# Patient Record
Sex: Male | Born: 1960 | Hispanic: No | Marital: Single | Smoking: Never smoker
Health system: Southern US, Community
[De-identification: ages and names within clinical notes are randomized; demographics above are authoritative.]

## PROBLEM LIST (undated history)

## (undated) DIAGNOSIS — R7989 Other specified abnormal findings of blood chemistry: Secondary | ICD-10-CM

## (undated) DIAGNOSIS — B009 Herpesviral infection, unspecified: Secondary | ICD-10-CM

## (undated) DIAGNOSIS — K589 Irritable bowel syndrome without diarrhea: Secondary | ICD-10-CM

## (undated) DIAGNOSIS — A6000 Herpesviral infection of urogenital system, unspecified: Secondary | ICD-10-CM

## (undated) DIAGNOSIS — N400 Enlarged prostate without lower urinary tract symptoms: Secondary | ICD-10-CM

## (undated) DIAGNOSIS — T7840XA Allergy, unspecified, initial encounter: Secondary | ICD-10-CM

## (undated) DIAGNOSIS — E785 Hyperlipidemia, unspecified: Secondary | ICD-10-CM

## (undated) DIAGNOSIS — I10 Essential (primary) hypertension: Secondary | ICD-10-CM

## (undated) DIAGNOSIS — K644 Residual hemorrhoidal skin tags: Secondary | ICD-10-CM

## (undated) DIAGNOSIS — F419 Anxiety disorder, unspecified: Secondary | ICD-10-CM

## (undated) DIAGNOSIS — K279 Peptic ulcer, site unspecified, unspecified as acute or chronic, without hemorrhage or perforation: Secondary | ICD-10-CM

## (undated) HISTORY — DX: Hyperlipidemia, unspecified: E78.5

## (undated) HISTORY — DX: Residual hemorrhoidal skin tags: K64.4

## (undated) HISTORY — DX: Anxiety disorder, unspecified: F41.9

## (undated) HISTORY — DX: Allergy, unspecified, initial encounter: T78.40XA

## (undated) HISTORY — DX: Herpesviral infection of urogenital system, unspecified: A60.00

## (undated) HISTORY — DX: Benign prostatic hyperplasia without lower urinary tract symptoms: N40.0

## (undated) HISTORY — DX: Peptic ulcer, site unspecified, unspecified as acute or chronic, without hemorrhage or perforation: K27.9

## (undated) HISTORY — DX: Irritable bowel syndrome, unspecified: K58.9

---

## 2005-10-19 ENCOUNTER — Emergency Department (HOSPITAL_COMMUNITY): Admission: EM | Admit: 2005-10-19 | Discharge: 2005-10-20 | Payer: Self-pay | Admitting: *Deleted

## 2005-10-24 ENCOUNTER — Emergency Department (HOSPITAL_COMMUNITY): Admission: EM | Admit: 2005-10-24 | Discharge: 2005-10-25 | Payer: Self-pay | Admitting: Emergency Medicine

## 2009-09-11 ENCOUNTER — Emergency Department (HOSPITAL_COMMUNITY): Admission: EM | Admit: 2009-09-11 | Discharge: 2009-09-12 | Payer: Self-pay | Admitting: Emergency Medicine

## 2009-10-29 ENCOUNTER — Emergency Department (HOSPITAL_COMMUNITY): Admission: EM | Admit: 2009-10-29 | Discharge: 2009-10-30 | Payer: Self-pay | Admitting: Emergency Medicine

## 2010-01-24 ENCOUNTER — Emergency Department (HOSPITAL_COMMUNITY): Admission: EM | Admit: 2010-01-24 | Discharge: 2010-01-24 | Payer: Self-pay | Admitting: Emergency Medicine

## 2010-08-18 ENCOUNTER — Emergency Department (HOSPITAL_COMMUNITY): Admission: EM | Admit: 2010-08-18 | Discharge: 2009-09-23 | Payer: Self-pay | Admitting: Emergency Medicine

## 2010-08-25 ENCOUNTER — Emergency Department (HOSPITAL_COMMUNITY)
Admission: EM | Admit: 2010-08-25 | Discharge: 2010-08-25 | Payer: Self-pay | Source: Home / Self Care | Admitting: Emergency Medicine

## 2010-09-22 ENCOUNTER — Emergency Department (HOSPITAL_COMMUNITY)
Admission: EM | Admit: 2010-09-22 | Discharge: 2010-09-22 | Payer: Self-pay | Source: Home / Self Care | Admitting: Emergency Medicine

## 2010-09-26 LAB — URINALYSIS, ROUTINE W REFLEX MICROSCOPIC
Bilirubin Urine: NEGATIVE
Hgb urine dipstick: NEGATIVE
Ketones, ur: NEGATIVE mg/dL
Nitrite: NEGATIVE
Protein, ur: NEGATIVE mg/dL
Specific Gravity, Urine: 1.007 (ref 1.005–1.030)
Urine Glucose, Fasting: NEGATIVE mg/dL
Urobilinogen, UA: 0.2 mg/dL (ref 0.0–1.0)
pH: 5.5 (ref 5.0–8.0)

## 2010-09-26 LAB — CBC
HCT: 45.3 % (ref 39.0–52.0)
Hemoglobin: 16.3 g/dL (ref 13.0–17.0)
MCH: 31.2 pg (ref 26.0–34.0)
MCHC: 36 g/dL (ref 30.0–36.0)
MCV: 86.6 fL (ref 78.0–100.0)
Platelets: 297 10*3/uL (ref 150–400)
RBC: 5.23 MIL/uL (ref 4.22–5.81)
RDW: 12.8 % (ref 11.5–15.5)
WBC: 8.6 10*3/uL (ref 4.0–10.5)

## 2010-09-26 LAB — DIFFERENTIAL
Basophils Absolute: 0 10*3/uL (ref 0.0–0.1)
Basophils Relative: 1 % (ref 0–1)
Eosinophils Absolute: 0.1 10*3/uL (ref 0.0–0.7)
Eosinophils Relative: 1 % (ref 0–5)
Lymphocytes Relative: 22 % (ref 12–46)
Lymphs Abs: 1.9 10*3/uL (ref 0.7–4.0)
Monocytes Absolute: 0.7 10*3/uL (ref 0.1–1.0)
Monocytes Relative: 9 % (ref 3–12)
Neutro Abs: 5.9 10*3/uL (ref 1.7–7.7)
Neutrophils Relative %: 68 % (ref 43–77)

## 2010-09-26 LAB — RAPID URINE DRUG SCREEN, HOSP PERFORMED
Amphetamines: NOT DETECTED
Barbiturates: NOT DETECTED
Benzodiazepines: NOT DETECTED
Cocaine: NOT DETECTED
Opiates: NOT DETECTED
Tetrahydrocannabinol: NOT DETECTED

## 2010-09-26 LAB — BASIC METABOLIC PANEL
BUN: 10 mg/dL (ref 6–23)
CO2: 26 mEq/L (ref 19–32)
Calcium: 9.5 mg/dL (ref 8.4–10.5)
Chloride: 107 mEq/L (ref 96–112)
Creatinine, Ser: 0.95 mg/dL (ref 0.4–1.5)
GFR calc Af Amer: >60 mL/min (ref >60)
GFR calc non Af Amer: >60 mL/min (ref >60)
Glucose, Bld: 103 mg/dL — ABNORMAL HIGH (ref 70–99)
Potassium: 4.2 mEq/L (ref 3.5–5.1)
Sodium: 142 mEq/L (ref 135–145)

## 2010-09-26 LAB — CK: Total CK: 1035 U/L — ABNORMAL HIGH (ref 7–232)

## 2010-09-27 ENCOUNTER — Emergency Department (HOSPITAL_COMMUNITY)
Admission: EM | Admit: 2010-09-27 | Discharge: 2010-09-27 | Payer: Self-pay | Source: Home / Self Care | Admitting: Emergency Medicine

## 2010-09-28 LAB — POCT I-STAT, CHEM 8
BUN: 11 mg/dL (ref 6–23)
Calcium, Ion: 1.16 mmol/L (ref 1.12–1.32)
Chloride: 100 mEq/L (ref 96–112)
Creatinine, Ser: 0.9 mg/dL (ref 0.4–1.5)
Glucose, Bld: 90 mg/dL (ref 70–99)
HCT: 46 % (ref 39.0–52.0)
Hemoglobin: 15.6 g/dL (ref 13.0–17.0)
Potassium: 3.7 mEq/L (ref 3.5–5.1)
Sodium: 140 mEq/L (ref 135–145)
TCO2: 27 mmol/L (ref 0–100)

## 2010-11-21 LAB — RAPID STREP SCREEN (MED CTR MEBANE ONLY): Streptococcus, Group A Screen (Direct): NEGATIVE

## 2010-11-27 LAB — COMPREHENSIVE METABOLIC PANEL
ALT: 25 U/L (ref 0–53)
AST: 23 U/L (ref 0–37)
Albumin: 3.9 g/dL (ref 3.5–5.2)
Alkaline Phosphatase: 59 U/L (ref 39–117)
BUN: 11 mg/dL (ref 6–23)
CO2: 26 mEq/L (ref 19–32)
Calcium: 8.8 mg/dL (ref 8.4–10.5)
Chloride: 106 mEq/L (ref 96–112)
Creatinine, Ser: 0.74 mg/dL (ref 0.4–1.5)
GFR calc Af Amer: >60 mL/min (ref >60)
GFR calc non Af Amer: >60 mL/min (ref >60)
Glucose, Bld: 112 mg/dL — ABNORMAL HIGH (ref 70–99)
Potassium: 3.4 mEq/L — ABNORMAL LOW (ref 3.5–5.1)
Sodium: 139 mEq/L (ref 135–145)
Total Bilirubin: 0.5 mg/dL (ref 0.3–1.2)
Total Protein: 6.5 g/dL (ref 6.0–8.3)

## 2010-11-27 LAB — POCT CARDIAC MARKERS
CKMB, poc: 1.7 ng/mL (ref 1.0–8.0)
CKMB, poc: 1.9 ng/mL (ref 1.0–8.0)
CKMB, poc: 2.4 ng/mL (ref 1.0–8.0)
Myoglobin, poc: 56.5 ng/mL (ref 12–200)
Myoglobin, poc: 56.9 ng/mL (ref 12–200)
Myoglobin, poc: 62.6 ng/mL (ref 12–200)
Troponin i, poc: <0.05 ng/mL (ref 0.00–0.09)
Troponin i, poc: <0.05 ng/mL (ref 0.00–0.09)
Troponin i, poc: <0.05 ng/mL (ref 0.00–0.09)

## 2010-11-27 LAB — DIFFERENTIAL
Basophils Absolute: 0 10*3/uL (ref 0.0–0.1)
Basophils Relative: 1 % (ref 0–1)
Eosinophils Absolute: 0.3 10*3/uL (ref 0.0–0.7)
Eosinophils Relative: 5 % (ref 0–5)
Lymphocytes Relative: 23 % (ref 12–46)
Lymphs Abs: 1.5 10*3/uL (ref 0.7–4.0)
Monocytes Absolute: 0.7 10*3/uL (ref 0.1–1.0)
Monocytes Relative: 10 % (ref 3–12)
Neutro Abs: 4.1 10*3/uL (ref 1.7–7.7)
Neutrophils Relative %: 61 % (ref 43–77)

## 2010-11-27 LAB — CBC
HCT: 43.7 % (ref 39.0–52.0)
Hemoglobin: 15.4 g/dL (ref 13.0–17.0)
MCHC: 35.3 g/dL (ref 30.0–36.0)
MCV: 89.6 fL (ref 78.0–100.0)
Platelets: 274 10*3/uL (ref 150–400)
RBC: 4.88 MIL/uL (ref 4.22–5.81)
RDW: 12.7 % (ref 11.5–15.5)
WBC: 6.6 10*3/uL (ref 4.0–10.5)

## 2010-11-27 LAB — POCT I-STAT, CHEM 8
BUN: 10 mg/dL (ref 6–23)
Calcium, Ion: 1.15 mmol/L (ref 1.12–1.32)
Chloride: 103 mEq/L (ref 96–112)
Creatinine, Ser: 0.7 mg/dL (ref 0.4–1.5)
Glucose, Bld: 88 mg/dL (ref 70–99)
HCT: 48 % (ref 39.0–52.0)
Hemoglobin: 16.3 g/dL (ref 13.0–17.0)
Potassium: 3.4 mEq/L — ABNORMAL LOW (ref 3.5–5.1)
Sodium: 140 mEq/L (ref 135–145)
TCO2: 28 mmol/L (ref 0–100)

## 2010-11-27 LAB — D-DIMER, QUANTITATIVE: D-Dimer, Quant: <0.22 ug/mL-FEU (ref 0.00–0.48)

## 2010-11-28 LAB — DIFFERENTIAL
Basophils Absolute: 0 10*3/uL (ref 0.0–0.1)
Basophils Relative: 0 % (ref 0–1)
Eosinophils Absolute: 0.1 10*3/uL (ref 0.0–0.7)
Eosinophils Relative: 1 % (ref 0–5)
Lymphocytes Relative: 21 % (ref 12–46)
Lymphs Abs: 1.8 10*3/uL (ref 0.7–4.0)
Monocytes Absolute: 0.7 10*3/uL (ref 0.1–1.0)
Monocytes Relative: 7 % (ref 3–12)
Neutro Abs: 6.4 10*3/uL (ref 1.7–7.7)
Neutrophils Relative %: 71 % (ref 43–77)

## 2010-11-28 LAB — COMPREHENSIVE METABOLIC PANEL
ALT: 28 U/L (ref 0–53)
AST: 39 U/L — ABNORMAL HIGH (ref 0–37)
Albumin: 4 g/dL (ref 3.5–5.2)
Alkaline Phosphatase: 50 U/L (ref 39–117)
BUN: 8 mg/dL (ref 6–23)
CO2: 23 mEq/L (ref 19–32)
Calcium: 9 mg/dL (ref 8.4–10.5)
Chloride: 103 mEq/L (ref 96–112)
Creatinine, Ser: 0.75 mg/dL (ref 0.4–1.5)
GFR calc Af Amer: >60 mL/min (ref >60)
GFR calc non Af Amer: >60 mL/min (ref >60)
Glucose, Bld: 110 mg/dL — ABNORMAL HIGH (ref 70–99)
Potassium: 4.2 mEq/L (ref 3.5–5.1)
Sodium: 134 mEq/L — ABNORMAL LOW (ref 135–145)
Total Bilirubin: 1.3 mg/dL — ABNORMAL HIGH (ref 0.3–1.2)
Total Protein: 6.7 g/dL (ref 6.0–8.3)

## 2010-11-28 LAB — POCT CARDIAC MARKERS
Myoglobin, poc: 94.6 ng/mL (ref 12–200)
Troponin i, poc: <0.05 ng/mL (ref 0.00–0.09)

## 2010-11-28 LAB — URINALYSIS, ROUTINE W REFLEX MICROSCOPIC
Bilirubin Urine: NEGATIVE
Glucose, UA: NEGATIVE mg/dL
Hgb urine dipstick: NEGATIVE
Ketones, ur: NEGATIVE mg/dL
Nitrite: NEGATIVE
Protein, ur: NEGATIVE mg/dL
Specific Gravity, Urine: 1.003 — ABNORMAL LOW (ref 1.005–1.030)
Urobilinogen, UA: 0.2 mg/dL (ref 0.0–1.0)
pH: 7 (ref 5.0–8.0)

## 2010-11-28 LAB — URINE CULTURE
Colony Count: NO GROWTH
Culture: NO GROWTH

## 2010-11-28 LAB — CBC
HCT: 44.4 % (ref 39.0–52.0)
Hemoglobin: 15.5 g/dL (ref 13.0–17.0)
MCHC: 34.8 g/dL (ref 30.0–36.0)
MCV: 90.3 fL (ref 78.0–100.0)
Platelets: 298 10*3/uL (ref 150–400)
RBC: 4.92 MIL/uL (ref 4.22–5.81)
RDW: 12.5 % (ref 11.5–15.5)
WBC: 9 10*3/uL (ref 4.0–10.5)

## 2010-11-30 LAB — POCT I-STAT, CHEM 8
BUN: 11 mg/dL (ref 6–23)
Calcium, Ion: 1 mmol/L — ABNORMAL LOW (ref 1.12–1.32)
HCT: 45 % (ref 39.0–52.0)
Hemoglobin: 15.3 g/dL (ref 13.0–17.0)
Sodium: 139 mEq/L (ref 135–145)
TCO2: 29 mmol/L (ref 0–100)

## 2010-11-30 LAB — POCT CARDIAC MARKERS
Myoglobin, poc: 47.5 ng/mL (ref 12–200)
Troponin i, poc: <0.05 ng/mL (ref 0.00–0.09)

## 2010-11-30 LAB — URINALYSIS, ROUTINE W REFLEX MICROSCOPIC
Bilirubin Urine: NEGATIVE
Glucose, UA: NEGATIVE mg/dL
Ketones, ur: NEGATIVE mg/dL
Nitrite: NEGATIVE
Protein, ur: NEGATIVE mg/dL
pH: 8 (ref 5.0–8.0)

## 2010-11-30 LAB — DIFFERENTIAL
Basophils Absolute: 0 10*3/uL (ref 0.0–0.1)
Basophils Relative: 1 % (ref 0–1)
Eosinophils Relative: 2 % (ref 0–5)
Lymphocytes Relative: 25 % (ref 12–46)
Monocytes Absolute: 0.8 10*3/uL (ref 0.1–1.0)

## 2010-11-30 LAB — CBC
HCT: 44.5 % (ref 39.0–52.0)
Hemoglobin: 15.6 g/dL (ref 13.0–17.0)
MCHC: 35.1 g/dL (ref 30.0–36.0)
Platelets: 314 10*3/uL (ref 150–400)
RDW: 12.6 % (ref 11.5–15.5)

## 2012-09-11 DIAGNOSIS — K279 Peptic ulcer, site unspecified, unspecified as acute or chronic, without hemorrhage or perforation: Secondary | ICD-10-CM

## 2012-09-11 HISTORY — DX: Peptic ulcer, site unspecified, unspecified as acute or chronic, without hemorrhage or perforation: K27.9

## 2012-10-01 ENCOUNTER — Emergency Department (HOSPITAL_COMMUNITY)
Admission: EM | Admit: 2012-10-01 | Discharge: 2012-10-01 | Disposition: A | Discharge status: Home / Self Care | Payer: Self-pay | Attending: Emergency Medicine | Admitting: Emergency Medicine

## 2012-10-01 ENCOUNTER — Emergency Department (HOSPITAL_COMMUNITY): Payer: Self-pay

## 2012-10-01 ENCOUNTER — Encounter (HOSPITAL_COMMUNITY): Payer: Self-pay | Admitting: Family Medicine

## 2012-10-01 DIAGNOSIS — I10 Essential (primary) hypertension: Secondary | ICD-10-CM | POA: Insufficient documentation

## 2012-10-01 DIAGNOSIS — Z79899 Other long term (current) drug therapy: Secondary | ICD-10-CM | POA: Insufficient documentation

## 2012-10-01 DIAGNOSIS — R531 Weakness: Secondary | ICD-10-CM

## 2012-10-01 DIAGNOSIS — Z8619 Personal history of other infectious and parasitic diseases: Secondary | ICD-10-CM | POA: Insufficient documentation

## 2012-10-01 DIAGNOSIS — R5383 Other fatigue: Secondary | ICD-10-CM | POA: Insufficient documentation

## 2012-10-01 DIAGNOSIS — R079 Chest pain, unspecified: Secondary | ICD-10-CM | POA: Insufficient documentation

## 2012-10-01 DIAGNOSIS — R5381 Other malaise: Secondary | ICD-10-CM | POA: Insufficient documentation

## 2012-10-01 HISTORY — DX: Herpesviral infection, unspecified: B00.9

## 2012-10-01 HISTORY — DX: Essential (primary) hypertension: I10

## 2012-10-01 HISTORY — DX: Other specified abnormal findings of blood chemistry: R79.89

## 2012-10-01 LAB — CBC
HCT: 46.3 % (ref 39.0–52.0)
Hemoglobin: 16.5 g/dL (ref 13.0–17.0)
MCV: 87.5 fL (ref 78.0–100.0)
RBC: 5.29 MIL/uL (ref 4.22–5.81)
RDW: 12.2 % (ref 11.5–15.5)
WBC: 6.6 10*3/uL (ref 4.0–10.5)

## 2012-10-01 LAB — POCT I-STAT, CHEM 8
BUN: 10 mg/dL (ref 6–23)
Calcium, Ion: 1.17 mmol/L (ref 1.12–1.23)
Chloride: 104 mEq/L (ref 96–112)
Glucose, Bld: 96 mg/dL (ref 70–99)
HCT: 47 % (ref 39.0–52.0)
Potassium: 4.2 mEq/L (ref 3.5–5.1)

## 2012-10-01 NOTE — ED Provider Notes (Signed)
History     CSN: [id number]  Arrival date & time 10/01/12  4098   First MD Initiated Contact with Patient 10/01/12 2019      Chief Complaint  Patient presents with  . Fatigue  . Chest Pain    (Consider location/radiation/quality/duration/timing/severity/associated sxs/prior treatment) Patient is a 52 y.o. male presenting with chest pain. The history is provided by the patient.  Chest Pain    patient here complaining of chronic fatigue and chest weakness. Symptoms have been persistent. He denies any anginal type chest pain. No fever or cough. Symptoms are nonexertional. No associated leg pain or swelling. No treatment done for this prior to arrival. Some shortness of breath that is nonexertional as well 2. No prior history of coronary disease.  Past Medical History  Diagnosis Date  . Low testosterone   . Hypertension   . Herpes     History reviewed. No pertinent past surgical history.  No family history on file.  History  Substance Use Topics  . Smoking status: Never Smoker   . Smokeless tobacco: Not on file  . Alcohol Use: No      Review of Systems  Cardiovascular: Positive for chest pain.  All other systems reviewed and are negative.    Allergies  Review of patient's allergies indicates no known allergies.  Home Medications   Current Outpatient Rx  Name  Route  Sig  Dispense  Refill  . ALPRAZOLAM 1 MG PO TABS   Oral   Take 1 mg by mouth 3 (three) times daily as needed. Anxiety         . IBUPROFEN 200 MG PO TABS   Oral   Take 200 mg by mouth every 6 (six) hours as needed. Pain         . NEBIVOLOL HCL 10 MG PO TABS   Oral   Take 10 mg by mouth daily.         Marland Kitchen OVER THE COUNTER MEDICATION   Oral   Take 1 tablet by mouth daily. 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 VALACYCLOVIR HCL 500 MG PO TABS   Oral   Take 500 mg by mouth 2 (two) times daily.           BP 135/70  Pulse 62  Temp 98.9 F (37.2 C) (Oral)  Resp 31  SpO2 100%  Physical Exam    Nursing note and vitals reviewed. Constitutional: He is oriented to person, place, and time. He appears well-developed and well-nourished.  Non-toxic appearance. No distress.  HENT:  Head: Normocephalic and atraumatic.  Eyes: Conjunctivae normal, EOM and lids are normal. Pupils are equal, round, and reactive to light.  Neck: Normal range of motion. Neck supple. No tracheal deviation present. No mass present.  Cardiovascular: Normal rate, regular rhythm and normal heart sounds.  Exam reveals no gallop.   No murmur heard. Pulmonary/Chest: Effort normal and breath sounds normal. No stridor. No respiratory distress. He has no decreased breath sounds. He has no wheezes. He has no rhonchi. He has no rales.  Abdominal: Soft. Normal appearance and bowel sounds are normal. He exhibits no distension. There is no tenderness. There is no rebound and no CVA tenderness.  Musculoskeletal: Normal range of motion. He exhibits no edema and no tenderness.  Neurological: He is alert and oriented to person, place, and time. He has normal strength. No cranial nerve deficit or sensory deficit. GCS eye subscore is 4. GCS verbal subscore is 5. GCS  motor subscore is 6.  Skin: Skin is warm and dry. No abrasion and no rash noted.  Psychiatric: He has a normal mood and affect. His speech is normal and behavior is normal.    ED Course  Procedures (including critical care time)   Labs Reviewed  POCT I-STAT, CHEM 8  POCT I-STAT TROPONIN I  CBC   No results found.   No diagnosis found.    MDM   Date: 10/01/2012  Rate: 65  Rhythm: normal sinus rhythm  QRS Axis: normal  Intervals: normal  ST/T Wave abnormalities: normal  Conduction Disutrbances:none  Narrative Interpretation:   Old EKG Reviewed: none available  9:19 PM Patient with negative workup here. No concern for ACS or pulmonary embolism. No signs of CVA. Patient followup with his medical doctor.        Toy Baker, MD 10/01/12 2120

## 2012-10-01 NOTE — ED Notes (Signed)
Also complaints of headache and nasal discharge

## 2012-10-01 NOTE — ED Notes (Signed)
Pt also states he has fevers inside body

## 2012-10-01 NOTE — ED Notes (Signed)
Patient states "I feel like I lose power." States that he has felt weak for the past 3 days. States that although he has been eating he feels "empty." States that he started having chest pain 4 days ago. Chest pain is intermittent with shortness of breath. Reports that he has a history of low testosterone but that he only takes half of his prescribed medication.

## 2013-03-17 ENCOUNTER — Encounter (HOSPITAL_COMMUNITY): Payer: Self-pay | Admitting: *Deleted

## 2013-03-17 ENCOUNTER — Emergency Department (HOSPITAL_COMMUNITY): Payer: Self-pay

## 2013-03-17 ENCOUNTER — Emergency Department (HOSPITAL_COMMUNITY)
Admission: EM | Admit: 2013-03-17 | Discharge: 2013-03-18 | Disposition: A | Discharge status: Home / Self Care | Payer: Self-pay | Attending: Emergency Medicine | Admitting: Emergency Medicine

## 2013-03-17 DIAGNOSIS — Z79899 Other long term (current) drug therapy: Secondary | ICD-10-CM | POA: Insufficient documentation

## 2013-03-17 DIAGNOSIS — D72829 Elevated white blood cell count, unspecified: Secondary | ICD-10-CM | POA: Insufficient documentation

## 2013-03-17 DIAGNOSIS — R509 Fever, unspecified: Secondary | ICD-10-CM | POA: Insufficient documentation

## 2013-03-17 DIAGNOSIS — I10 Essential (primary) hypertension: Secondary | ICD-10-CM | POA: Insufficient documentation

## 2013-03-17 DIAGNOSIS — Z8619 Personal history of other infectious and parasitic diseases: Secondary | ICD-10-CM | POA: Insufficient documentation

## 2013-03-17 DIAGNOSIS — R109 Unspecified abdominal pain: Secondary | ICD-10-CM | POA: Insufficient documentation

## 2013-03-17 LAB — URINALYSIS, ROUTINE W REFLEX MICROSCOPIC
Bilirubin Urine: NEGATIVE
Hgb urine dipstick: NEGATIVE
Ketones, ur: NEGATIVE mg/dL
Protein, ur: NEGATIVE mg/dL
Urobilinogen, UA: 0.2 mg/dL (ref 0.0–1.0)

## 2013-03-17 LAB — CBC WITH DIFFERENTIAL/PLATELET
Eosinophils Relative: 1 % (ref 0–5)
Hemoglobin: 16.6 g/dL (ref 13.0–17.0)
Lymphocytes Relative: 5 % — ABNORMAL LOW (ref 12–46)
Lymphs Abs: 0.7 10*3/uL (ref 0.7–4.0)
MCV: 82 fL (ref 78.0–100.0)
Monocytes Relative: 6 % (ref 3–12)
Neutrophils Relative %: 88 % — ABNORMAL HIGH (ref 43–77)
Platelets: 321 10*3/uL (ref 150–400)
RBC: 5.56 MIL/uL (ref 4.22–5.81)
WBC: 13.6 10*3/uL — ABNORMAL HIGH (ref 4.0–10.5)

## 2013-03-17 LAB — COMPREHENSIVE METABOLIC PANEL
ALT: 24 U/L (ref 0–53)
Alkaline Phosphatase: 51 U/L (ref 39–117)
BUN: 13 mg/dL (ref 6–23)
CO2: 24 mEq/L (ref 19–32)
GFR calc Af Amer: >90 mL/min (ref >90)
GFR calc non Af Amer: 81 mL/min — ABNORMAL LOW (ref >90)
Glucose, Bld: 113 mg/dL — ABNORMAL HIGH (ref 70–99)
Potassium: 4.1 mEq/L (ref 3.5–5.1)
Sodium: 130 mEq/L — ABNORMAL LOW (ref 135–145)

## 2013-03-17 MED ORDER — IOHEXOL 300 MG/ML  SOLN
50.0000 mL | Freq: Once | INTRAMUSCULAR | Status: AC | PRN
  Administered 2013-03-17: 50 mL via ORAL

## 2013-03-17 MED ORDER — ACETAMINOPHEN 325 MG PO TABS
650.0000 mg | ORAL_TABLET | Freq: Once | ORAL | Status: AC
Start: 2013-03-17 — End: 2013-03-17
  Administered 2013-03-17: 650 mg via ORAL
  Filled 2013-03-17: qty 2

## 2013-03-17 MED ORDER — IOHEXOL 300 MG/ML  SOLN
100.0000 mL | Freq: Once | INTRAMUSCULAR | Status: AC | PRN
  Administered 2013-03-17: 100 mL via INTRAVENOUS

## 2013-03-17 NOTE — ED Provider Notes (Signed)
History    CSN: [id number] Arrival date & time 03/17/13  2042  First MD Initiated Contact with Patient 03/17/13 2119     Chief Complaint  Patient presents with  . Abdominal Pain   (Consider location/radiation/quality/duration/timing/severity/associated sxs/prior Treatment) HPI Comments: This is a 52 year old gentleman, who, reports, that he's had 4 days of diffuse abdominal pain, worse when he eats he, feels bloated.  Denies nausea, vomiting, diarrhea, states he took a laxative thinking.  He was, constipated, he's had 2 normal bowel movements one last night, and one this morning, but is still feeling vaguely uncomfortable.  Reports intermittent episodes of chills for the past 24 hours.  He has not taken his temperature were taken any medication for comfort or fever.  Denies any urinary tract symptoms, or penile discharge.  He takes Valtrex for genital herpes.  He is not currently having an outbreak  Patient is a 52 y.o. male presenting with abdominal pain. The history is provided by the patient.  Abdominal Pain This is a new problem. The current episode started in the past 7 days. The problem occurs constantly. The problem has been gradually worsening. Associated symptoms include abdominal pain, anorexia, chills and a fever. Pertinent negatives include no coughing, nausea or rash.   Past Medical History  Diagnosis Date  . Low testosterone   . Hypertension   . Herpes    History reviewed. No pertinent past surgical history. No family history on file. History  Substance Use Topics  . Smoking status: Never Smoker   . Smokeless tobacco: Not on file  . Alcohol Use: No    Review of Systems  Constitutional: Positive for fever and chills.  Respiratory: Negative for cough and shortness of breath.   Gastrointestinal: Positive for abdominal pain, constipation, abdominal distention and anorexia. Negative for nausea, diarrhea and blood in stool.  Genitourinary: Negative for dysuria.  Skin:  Negative for rash and wound.  Neurological: Negative for dizziness.  All other systems reviewed and are negative.    Allergies  Doxycycline  Home Medications   Current Outpatient Rx  Name  Route  Sig  Dispense  Refill  . ALPRAZolam (XANAX) 1 MG tablet   Oral   Take 0.5 mg by mouth 3 (three) times daily as needed (Takes 1/2   tablet). Anxiety         . lisinopril-hydrochlorothiazide (PRINZIDE,ZESTORETIC) 20-12.5 MG per tablet   Oral   Take 1 tablet by mouth daily.         . valACYclovir (VALTREX) 500 MG tablet   Oral   Take 500 mg by mouth 2 (two) times daily.         Marland Kitchen HYDROcodone-acetaminophen (NORCO/VICODIN) 5-325 MG per tablet      Take 1-2 tablets by mouth every 6 hours as needed for pain.   15 tablet   0   . ibuprofen (ADVIL,MOTRIN) 200 MG tablet   Oral   Take 200 mg by mouth every 6 (six) hours as needed. Pain          BP 116/66  Pulse 96  Temp(Src) 97.6 F (36.4 C) (Oral)  Resp 18  Ht 5\' 6"  (1.676 m)  Wt 203 lb 2 oz (92.137 kg)  BMI 32.8 kg/m2  SpO2 97% Physical Exam  Nursing note and vitals reviewed. Constitutional: He appears well-developed and well-nourished.  HENT:  Head: Normocephalic.  Eyes: Pupils are equal, round, and reactive to light.  Neck: Normal range of motion.  Cardiovascular: Regular rhythm.  Tachycardia present.  Pulmonary/Chest: Effort normal and breath sounds normal. He exhibits no tenderness.  Abdominal: Soft. He exhibits distension. Bowel sounds are increased. There is no tenderness. There is no rebound and no guarding.  Genitourinary: Penis normal.  Neurological: He is alert.  Skin: Skin is warm and dry.    ED Course  Procedures (including critical care time) Labs Reviewed  CBC WITH DIFFERENTIAL - Abnormal; Notable for the following:    WBC 13.6 (*)    MCHC 36.4 (*)    Neutrophils Relative % 88 (*)    Neutro Abs 11.9 (*)    Lymphocytes Relative 5 (*)    All other components within normal limits  COMPREHENSIVE  METABOLIC PANEL - Abnormal; Notable for the following:    Sodium 130 (*)    Chloride 94 (*)    Glucose, Bld 113 (*)    GFR calc non Af Amer 81 (*)    All other components within normal limits  LIPASE, BLOOD  URINALYSIS, ROUTINE W REFLEX MICROSCOPIC   No results found. 1. Abdominal pain   2. Fever   3. Leukocytosis     MDM   Patient states he is feeling, better.  He still slightly, tachycardic.  We'll give additional liter of saline.  His temperature has normalized without a source.  Chest x-ray, CT scan of the abdomen, pelvis, or noncontributory.  For fever.  He does not have a urinary tract infection.  She was questioned more extensively about possible tick exposure.  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

## 2013-03-17 NOTE — ED Notes (Signed)
Pt c/o abd pain since Friday; no difficulty with urination; denies vomiting; nausea

## 2013-03-18 ENCOUNTER — Emergency Department (HOSPITAL_COMMUNITY): Payer: Self-pay

## 2013-03-18 MED ORDER — HYDROCODONE-ACETAMINOPHEN 5-325 MG PO TABS
1.0000 | ORAL_TABLET | Freq: Once | ORAL | Status: AC
  Administered 2013-03-18: 1 via ORAL
  Filled 2013-03-18: qty 1

## 2013-03-18 MED ORDER — SODIUM CHLORIDE 0.9 % IV BOLUS (SEPSIS)
1000.0000 mL | Freq: Once | INTRAVENOUS | Status: AC
  Administered 2013-03-18: 1000 mL via INTRAVENOUS

## 2013-03-18 MED ORDER — HYDROCODONE-ACETAMINOPHEN 5-325 MG PO TABS: ORAL_TABLET | ORAL | Status: DC

## 2013-03-18 NOTE — ED Provider Notes (Signed)
This is a sign out from NP Tomasa Blase at shift change: Nathan Cherry is a 52 y.o. male complaining of abdominal distention Fever and white count, no RF for RMSF/ricketsial disease. CT shows no abnormalities. Plan is to further hydrate the patient before discharge.  6:53 AM seen and reexamined at the bedside, abdominal exam is benign with very minimal diffuse tenderness to palpation with no guarding or rebound. Patient states that his pain is much improved after receiving fluids and by mouth Vicodin.  Pt is hemodynamically stable, appropriate for, and amenable to discharge at this time. Pt verbalized understanding and agrees with care plan. Outpatient follow-up and specific return precautions discussed.    Filed Vitals:   03/17/13 2046 03/18/13 0116 03/18/13 0519 03/18/13 0753  BP: 115/66 122/87 117/74 116/66  Pulse: 136 124 101 96  Temp: 101.9 F (38.8 C)   97.6 F (36.4 C)  TempSrc: Oral   Oral  Resp: 26 18 18 18   Height: 5\' 6"  (1.676 m)     Weight: 203 lb 2 oz (92.137 kg)     SpO2: 94% 100% 98% 97%     Wynetta Emery, PA-C 03/18/13 1035

## 2013-03-19 NOTE — ED Provider Notes (Signed)
Medical screening examination/treatment/procedure(s) were performed by non-physician practitioner and as supervising physician I was immediately available for consultation/collaboration.  John-Adam Daltyn Degroat, M.D.     John-Adam Ennis Heavner, MD 03/19/13 0404 

## 2013-03-23 NOTE — ED Provider Notes (Signed)
Medical screening examination/treatment/procedure(s) were performed by non-physician practitioner and as supervising physician I was immediately available for consultation/collaboration.  Jones Skene, M.D.     Jones Skene, MD 03/23/13 [phone]

## 2013-06-16 ENCOUNTER — Ambulatory Visit: Payer: No Typology Code available for payment source | Attending: Internal Medicine | Admitting: Internal Medicine

## 2013-06-16 ENCOUNTER — Encounter: Payer: Self-pay | Admitting: Internal Medicine

## 2013-06-16 VITALS — BP 122/67 | HR 74 | Temp 99.2°F | Resp 18 | Wt 208.0 lb

## 2013-06-16 DIAGNOSIS — H109 Unspecified conjunctivitis: Secondary | ICD-10-CM

## 2013-06-16 DIAGNOSIS — Z23 Encounter for immunization: Secondary | ICD-10-CM

## 2013-06-16 DIAGNOSIS — I10 Essential (primary) hypertension: Secondary | ICD-10-CM | POA: Insufficient documentation

## 2013-06-16 DIAGNOSIS — J309 Allergic rhinitis, unspecified: Secondary | ICD-10-CM | POA: Insufficient documentation

## 2013-06-16 LAB — CBC
MCH: 31.3 pg (ref 26.0–34.0)
MCV: 86.6 fL (ref 78.0–100.0)
Platelets: 362 10*3/uL (ref 150–400)
RDW: 13.1 % (ref 11.5–15.5)

## 2013-06-16 LAB — COMPLETE METABOLIC PANEL WITH GFR
ALT: 56 U/L — ABNORMAL HIGH (ref 0–53)
AST: 29 U/L (ref 0–37)
Albumin: 4.6 g/dL (ref 3.5–5.2)
BUN: 11 mg/dL (ref 6–23)
CO2: 29 mEq/L (ref 19–32)
Calcium: 10.1 mg/dL (ref 8.4–10.5)
Chloride: 102 mEq/L (ref 96–112)
Creat: 0.95 mg/dL (ref 0.50–1.35)
GFR, Est African American: >89 mL/min
Potassium: 3.9 mEq/L (ref 3.5–5.3)

## 2013-06-16 LAB — LIPID PANEL: Cholesterol: 200 mg/dL (ref 0–200)

## 2013-06-16 LAB — TSH: TSH: 1.107 u[IU]/mL (ref 0.350–4.500)

## 2013-06-16 MED ORDER — CETIRIZINE HCL 10 MG PO CAPS: 1.0000 | ORAL_CAPSULE | Freq: Every evening | ORAL | Status: DC | PRN

## 2013-06-16 MED ORDER — CETIRIZINE HCL 10 MG PO CAPS: 1.0000 | ORAL_CAPSULE | Freq: Every day | ORAL | Status: DC

## 2013-06-16 MED ORDER — IBUPROFEN 600 MG PO TABS: 600.0000 mg | ORAL_TABLET | Freq: Four times a day (QID) | ORAL | Status: DC | PRN

## 2013-06-16 NOTE — Progress Notes (Signed)
Pt is here to est care C/o of cold sxs onset 2 weeks Sxs include: intermittent HA, facial pressure, runny nose, SOB Denies: fevers, wheezing Taking OTC cold meds w/no relief Alert w/no signs of acute distress.

## 2013-06-16 NOTE — Progress Notes (Signed)
Patient ID: Nathan Cherry, male   DOB: Sep 03, 1960, 52 y.o.   MRN: [id number]  Patient Demographics  Nathan Cherry, is a 52 y.o. male  CSN: [id number]  MRN: [id number]  DOB - June 24, 1960  Outpatient Primary MD for the patient is Nathan Lewandowsky, MD   With History of -  Past Medical History  Diagnosis Date  . Low testosterone   . Hypertension   . Herpes       History reviewed. No pertinent past surgical history.  in for   Chief Complaint  Patient presents with  . Establish Care     HPI  Nathan Cherry  is a 52 y.o. male, with history of hypertension, shingles in the past, who quit alcohol more than 7 years ago, no other medical problems that are known, comes in to establish care. Only subjective complaint is some frontal headaches, itching in the eyes and stuffy nose from time to time. No fever chills. No chest pain palpitations cough phlegm shortness of breath, no bowel pain or discomfort. No focal weakness.    Review of Systems    In addition to the HPI above  No Fever-chills, No Headache, No changes with Vision or hearing, No problems swallowing food or Liquids, No Chest pain, Cough or Shortness of Breath, No Abdominal pain, No Nausea or Vommitting, Bowel movements are regular, No Blood in stool or Urine, No dysuria, No new skin rashes or bruises, No new joints pains-aches,  No new weakness, tingling, numbness in any extremity, No recent weight gain or loss, No polyuria, polydypsia or polyphagia, No significant Mental Stressors.  A full 10 point Review of Systems was done, except as stated above, all other Review of Systems were negative.   Social History History  Substance Use Topics  . Smoking status: Never Smoker   . Smokeless tobacco: Not on file  . Alcohol Use: No      Family History No history of diabetes mellitus or heart problems at young age  Prior to Admission medications   Medication Sig Start Date End Date Taking?  Authorizing Provider  acyclovir (ZOVIRAX) 400 MG tablet Take 400 mg by mouth every 4 (four) hours while awake.   Yes Historical Provider, MD  metoprolol succinate (TOPROL-XL) 50 MG 24 hr tablet Take 50 mg by mouth daily. Take with or immediately following a meal.   Yes Historical Provider, MD  pantoprazole (PROTONIX) 40 MG tablet Take 40 mg by mouth daily.   Yes Historical Provider, MD  ALPRAZolam Prudy Feeler) 1 MG tablet Take 0.5 mg by mouth 3 (three) times daily as needed (Takes 1/2   tablet). Anxiety    Historical Provider, MD  Cetirizine HCl (ZYRTEC ALLERGY) 10 MG CAPS Take 1 capsule (10 mg total) by mouth daily at 8 pm. 06/16/13   Leroy Sea, MD  HYDROcodone-acetaminophen (NORCO/VICODIN) 5-325 MG per tablet Take 1-2 tablets by mouth every 6 hours as needed for pain. 03/18/13   Nicole Pisciotta, PA-C  ibuprofen (ADVIL,MOTRIN) 200 MG tablet Take 200 mg by mouth every 6 (six) hours as needed. Pain    Historical Provider, MD  lisinopril-hydrochlorothiazide (PRINZIDE,ZESTORETIC) 20-12.5 MG per tablet Take 1 tablet by mouth daily.    Historical Provider, MD  valACYclovir (VALTREX) 500 MG tablet Take 500 mg by mouth 2 (two) times daily.    Historical Provider, MD    Allergies  Allergen Reactions  . Doxycycline Itching    Physical Exam  Vitals  Blood pressure 122/67, pulse 74, temperature 99.2 F (37.3  C), temperature source Oral, resp. rate 18, weight 208 lb (94.348 kg), SpO2 99.00%.   1. General middle-aged Hispanic male sitting on clinic examination table in no apparent distress,     2. Normal affect and insight, Not Suicidal or Homicidal, Awake Alert, Oriented X 3.  3. No F.N deficits, ALL C.Nerves Intact, Strength 5/5 all 4 extremities, Sensation intact all 4 extremities, Plantars down going.  4. Ears and Eyes appear Normal, Conjunctivae clear, PERRLA. Moist Oral Mucosa. Skin overlying Frontal and maxillary sinuses  5. Supple Neck, No JVD, No cervical lymphadenopathy appriciated, No  Carotid Bruits.  6. Symmetrical Chest wall movement, Good air movement bilaterally, CTAB.  7. RRR, No Gallops, Rubs or Murmurs, No Parasternal Heave.  8. Positive Bowel Sounds, Abdomen Soft, Non tender, No organomegaly appriciated,No rebound -guarding or rigidity.  9.  No Cyanosis, Normal Skin Turgor, No Skin Rash or Bruise.  10. Good muscle tone,  joints appear normal , no effusions, Normal ROM.  11. No Palpable Lymph Nodes in Neck or Axillae     Data Review  CBC No results found for this basename: WBC, HGB, HCT, PLT, MCV, MCH, MCHC, RDW, NEUTRABS, LYMPHSABS, MONOABS, EOSABS, BASOSABS, BANDABS, BANDSABD,  in the last 168 hours ------------------------------------------------------------------------------------------------------------------  Chemistries  No results found for this basename: NA, K, CL, CO2, GLUCOSE, BUN, CREATININE, GFRCGP, CALCIUM, MG, AST, ALT, ALKPHOS, BILITOT,  in the last 168 hours ------------------------------------------------------------------------------------------------------------------ CrCl is unknown because both a height and weight (above a minimum accepted value) are required for this calculation. ------------------------------------------------------------------------------------------------------------------ No results found for this basename: TSH, T4TOTAL, FREET3, T3FREE, THYROIDAB,  in the last 72 hours   Coagulation profile No results found for this basename: INR, PROTIME,  in the last 168 hours ------------------------------------------------------------------------------------------------------------------- No results found for this basename: DDIMER,  in the last 72 hours -------------------------------------------------------------------------------------------------------------------  Cardiac Enzymes No results found for this basename: CK, CKMB, TROPONINI, MYOGLOBIN,  in the last 168  hours ------------------------------------------------------------------------------------------------------------------ No components found with this basename: POCBNP,    ---------------------------------------------------------------------------------------------------------------  Urinalysis    Component Value Date/Time   COLORURINE YELLOW 03/17/2013 2053   APPEARANCEUR CLEAR 03/17/2013 2053   LABSPEC 1.006 03/17/2013 2053   PHURINE 6.5 03/17/2013 2053   GLUCOSEU NEGATIVE 03/17/2013 2053   HGBUR NEGATIVE 03/17/2013 2053   BILIRUBINUR NEGATIVE 03/17/2013 2053   KETONESUR NEGATIVE 03/17/2013 2053   PROTEINUR NEGATIVE 03/17/2013 2053   UROBILINOGEN 0.2 03/17/2013 2053   NITRITE NEGATIVE 03/17/2013 2053   LEUKOCYTESUR NEGATIVE 03/17/2013 2053       Assessment and plan  Allergic sinusitis. This is a long-standing issue which flares up during the season, placed him on Zyrtec, he already has Flonase which he will continue to take. No signs of infection.  Hypertension stable on present regimen continue   Obesity counseled on diet exercise.   Routine health maintenance.  Screening labs. CBC, CMP, TSH, A1c, lipid panel - ordered  Flu and tetanus shot given   GI referral made for colonoscopy          Leroy Sea M.D on 06/16/2013 at 10:51 AM

## 2013-06-17 ENCOUNTER — Telehealth: Payer: Self-pay

## 2013-06-17 NOTE — Telephone Encounter (Signed)
Nathan Cherry can you call this patient and let him know his results His cholesterol is high Also needs an appt for one month for follow up  thanks

## 2013-06-17 NOTE — Progress Notes (Signed)
Quick Note:  Pt to go on Heart Healthy Low Carb diet, exercise 5/week, come back in 4 weeks for and 15 min appointment Lipid panel and CBC. ______

## 2013-06-17 NOTE — Telephone Encounter (Signed)
Message copied by Lestine Mount on Tue Jun 17, 2013  5:06 PM ------      Message from: [facility], [place] [facility]      Created: Tue Jun 17, 2013  3:07 PM       Pt to go on Heart Healthy Low Carb diet, exercise 5/week, come back in 4 weeks for and 15 min appointment Lipid panel and CBC. ------

## 2013-06-18 ENCOUNTER — Telehealth: Payer: Self-pay

## 2013-06-18 NOTE — Telephone Encounter (Signed)
Patient was a walk in for his lab results Had Clydie Braun interpret for me  Is aware os healthy heart diet he needs to follow

## 2013-06-19 NOTE — Telephone Encounter (Signed)
Left a message for patient to call me back.

## 2013-07-07 ENCOUNTER — Ambulatory Visit: Payer: No Typology Code available for payment source | Attending: Internal Medicine | Admitting: Internal Medicine

## 2013-07-07 ENCOUNTER — Encounter: Payer: Self-pay | Admitting: Internal Medicine

## 2013-07-07 VITALS — BP 148/84 | HR 63 | Temp 98.0°F | Resp 16 | Ht 67.0 in | Wt 211.0 lb

## 2013-07-07 DIAGNOSIS — F411 Generalized anxiety disorder: Secondary | ICD-10-CM

## 2013-07-07 DIAGNOSIS — L290 Pruritus ani: Secondary | ICD-10-CM

## 2013-07-07 DIAGNOSIS — L299 Pruritus, unspecified: Secondary | ICD-10-CM | POA: Insufficient documentation

## 2013-07-07 DIAGNOSIS — I10 Essential (primary) hypertension: Secondary | ICD-10-CM

## 2013-07-07 MED ORDER — NYSTATIN-TRIAMCINOLONE 100000-0.1 UNIT/GM-% EX OINT: TOPICAL_OINTMENT | Freq: Two times a day (BID) | CUTANEOUS | Status: DC

## 2013-07-07 MED ORDER — HYDROCORTISONE ACETATE 25 MG RE SUPP: 25.0000 mg | Freq: Two times a day (BID) | RECTAL | Status: DC

## 2013-07-07 NOTE — Progress Notes (Signed)
Patient ID: Nathan Cherry, male   DOB: 11/26/1959, 52 y.o.   MRN: [id number] Patient Demographics  Ariyon Mittleman, is a 52 y.o. male  JYN:[id number]  [id number]  DOB - 11/26/1959  Chief Complaint  Patient presents with  . Follow-up        Subjective:   Nathan Cherry is a 52 y.o. male here today for a follow up visit. Patient has multiple nonspecific complaints including pruritus in the anus, back pain, dizziness after eating, history of low testosterone. He wants to be tested for testosterone again, even though previous test shows testosterone level of 700, he also want to be tested for cholesterol even though last test was done 3 weeks ago. He complains of nonspecific anxiety during exercise, denies chest pain or shortness of breath, but that he feels jittery. No diarrhea or any symptoms of hypothyroidism or hyperthyroidism. He is on some over-the-counter medications including garlic. He does not smoke cigarette, she does not drink alcohol. Patient was present during this with his daughter. Patient has No headache, No chest pain, No abdominal pain - No Nausea, No new weakness tingling or numbness, No Cough - SOB.  ALLERGIES: Allergies  Allergen Reactions  . Doxycycline Itching    PAST MEDICAL HISTORY: Past Medical History  Diagnosis Date  . Low testosterone   . Hypertension   . Herpes     MEDICATIONS AT HOME: Prior to Admission medications   Medication Sig Start Date End Date Taking? Authorizing Provider  acyclovir (ZOVIRAX) 400 MG tablet Take 400 mg by mouth every 4 (four) hours while awake.   Yes Historical Provider, MD  Cetirizine HCl (ZYRTEC ALLERGY) 10 MG CAPS Take 1 capsule (10 mg total) by mouth daily at 8 pm. 06/16/13  Yes Leroy Sea, MD  ibuprofen (ADVIL,MOTRIN) 600 MG tablet Take 1 tablet (600 mg total) by mouth every 6 (six) hours as needed for headache. Pain 06/16/13  Yes Leroy Sea, MD  metoprolol succinate (TOPROL-XL) 50 MG 24 hr  tablet Take 50 mg by mouth daily. Take with or immediately following a meal.   Yes Historical Provider, MD  pantoprazole (PROTONIX) 40 MG tablet Take 40 mg by mouth daily.   Yes Historical Provider, MD  hydrocortisone (ANUSOL-HC) 25 MG suppository Place 1 suppository (25 mg total) rectally 2 (two) times daily. 07/07/13   Jeanann Lewandowsky, MD  nystatin-triamcinolone ointment (MYCOLOG) Apply topically 2 (two) times daily. 07/07/13   Jeanann Lewandowsky, MD  valACYclovir (VALTREX) 500 MG tablet Take 500 mg by mouth 2 (two) times daily.    Historical Provider, MD     Objective:   Filed Vitals:   07/07/13 1413  BP: 148/84  Pulse: 63  Temp: 98 F (36.7 C)  TempSrc: Oral  Resp: 16  Height: 5\' 7"  (1.702 m)  Weight: 211 lb (95.709 kg)  SpO2: 98%    Exam General appearance : Awake, alert, not in any distress. Speech Clear. Not toxic looking HEENT: Atraumatic and Normocephalic, pupils equally reactive to light and accomodation Neck: supple, no JVD. No cervical lymphadenopathy.  Chest:Good air entry bilaterally, no added sounds  CVS: S1 S2 regular, no murmurs.  Abdomen: Bowel sounds present, Non tender and not distended with no gaurding, rigidity or rebound. Extremities: B/L Lower Ext shows no edema, both legs are warm to touch Neurology: Awake alert, and oriented X 3, CN II-XII intact, Non focal Skin:No Rash Wounds:N/A   Data Review   CBC No results found for this basename: WBC, HGB, HCT, PLT, MCV,  MCH, MCHC, RDW, NEUTRABS, LYMPHSABS, MONOABS, EOSABS, BASOSABS, BANDABS, BANDSABD,  in the last 168 hours  Chemistries   No results found for this basename: NA, K, CL, CO2, GLUCOSE, BUN, CREATININE, GFRCGP, CALCIUM, MG, AST, ALT, ALKPHOS, BILITOT,  in the last 168 hours ------------------------------------------------------------------------------------------------------------------ No results found for this basename: HGBA1C,  in the last 72  hours ------------------------------------------------------------------------------------------------------------------ No results found for this basename: CHOL, HDL, LDLCALC, TRIG, CHOLHDL, LDLDIRECT,  in the last 72 hours ------------------------------------------------------------------------------------------------------------------ No results found for this basename: TSH, T4TOTAL, FREET3, T3FREE, THYROIDAB,  in the last 72 hours ------------------------------------------------------------------------------------------------------------------ No results found for this basename: VITAMINB12, FOLATE, FERRITIN, TIBC, IRON, RETICCTPCT,  in the last 72 hours  Coagulation profile  No results found for this basename: INR, PROTIME,  in the last 168 hours    Assessment & Plan   Patient Active Problem List   Diagnosis Date Noted  . Anal pruritus 07/07/2013  . Generalized anxiety disorder 07/07/2013  . Allergic sinusitis 06/16/2013  . HTN (hypertension) 06/16/2013     Plan: Anal pruritus Anusol suppository one PR daily Nystatin triamcinolone ointment apply locally twice a day  Anxiety Patient has been counseled extensively about anxiety, and the need to practice relaxation and exercise Patient was extensively counseled about nutrition and exercise  Hypertension Patient was encouraged to be compliant with blood pressure medication Continue metoprolol 50 mg XL tablet by mouth every 24 hours DASH diet  Follow up in 3 months when necessary    The patient was given clear instructions to go to ER or return to medical center if symptoms don't improve, worsen or new problems develop. The patient verbalized understanding. The patient was told to call to get lab results if they haven't heard anything in the next week.    Jeanann Lewandowsky, MD, MHA, FACP, FAAP [facility] [place], [place] [phone]   07/07/2013, 2:45 PM

## 2013-07-07 NOTE — Patient Instructions (Signed)
Ansiedad y crisis de angustia  (Anxiety and Panic Attacks)  El profesional que lo asiste le ha informado que usted padece ansiedad o crisis de angustia. Este trastorno puede presentarse de varias formas. La mayor parte de las veces las crisis aparecen de modo repentino y sin aviso. Se producen en cualquier momento del da, incluso durante el sueo, y en cualquier etapa de la vida. Pueden ser muy intensas e inexplicadas. Aunque un ataque de pnico puede ser muy atemorizante, no produce daos fsicos. Algunas veces se desconoce la causa de la ansiedad. La ansiedad es un mecanismo protector del organismo en su respuesta de lucha o escape. Muchas de estas situaciones de percepcin de peligro son en realidad situaciones no fsicas (como la ansiedad de perder el trabajo).  CAUSAS  Las causas de la ansiedad o de un ataque de pnico pueden ser muchas. Los ataques de pnico pueden ocurrir en personas sanas en una serie de circunstancias. Es posible que haya una causa gentica de los ataques de pnico. Algunos medicamentos pueden provocar ansiedad como efecto secundario.  SNTOMAS  Algunas de las sensaciones ms comunes son:   Terror intenso.   Vahdos, desfallecimiento.   Golpes de fro y calor.   Temor a enloquecer.   Sentimiento de irrealidad.   Sudoracin.   Temblores.   Dolor en el pecho y latidos irregulares (palpitaciones).   Sensaciones de ahogo o sofocos.   Sentimiento de peligro inminente y de que la muerte est prxima.   Hormigueo en las extremidades que puede venir de la respiracin agitada.   Alteracin de la realidad (desrealizacin).   Sentirse separado de uno mismo (despersonalizacin).  Estos son los sntomas (problemas) ms comunes y pueden combinarse para presentar la crisis de angustia.   DIAGNSTICO  La evaluacin que realice el profesional depender del tipo de sntomas que est experimentando. El diagnstico de la ansiedad o de ataque de pnico se realiza cuando no se encuentra ninguna  enfermedad fsica que pueda determinarse como la causa de los sntomas.  TRATAMIENTO  El tratamiento para prevenir la ansiedad y los ataques de pnico incluye:   Evitar las circunstancias que causen ansiedad.   Reaseguro y relajacin.   Ejercicio regular.   Terapias de relajacin, como yoga.   Psicoterapia con un psiquiatra o terapeuta.   Evitar la cafena, el alcohol y las drogas ilegales.   Medicamentos de prescripcin.  SOLICITE ATENCIN MDICA DE INMEDIATO SI:   Usted experimenta sntomas de ataque de pnico que son distintos a sus sntomas usuales.   Tiene cualquier sntoma que empeora o lo preocupa.  Document Released: 08/28/2005 Document Revised: 11/20/2011  [employer] Patient Information 2014 [employer].

## 2013-07-07 NOTE — Progress Notes (Signed)
Pt reports that he is still having frequent headaches. Pt states that he is having anxiety and feeling nervous after eating and exercising.

## 2013-08-12 ENCOUNTER — Emergency Department (HOSPITAL_COMMUNITY)
Admission: EM | Admit: 2013-08-12 | Discharge: 2013-08-12 | Disposition: A | Discharge status: Home / Self Care | Payer: No Typology Code available for payment source | Source: Home / Self Care | Attending: Family Medicine | Admitting: Family Medicine

## 2013-08-12 ENCOUNTER — Encounter (HOSPITAL_COMMUNITY): Payer: Self-pay | Admitting: Emergency Medicine

## 2013-08-12 DIAGNOSIS — J029 Acute pharyngitis, unspecified: Secondary | ICD-10-CM

## 2013-08-12 DIAGNOSIS — H1010 Acute atopic conjunctivitis, unspecified eye: Secondary | ICD-10-CM

## 2013-08-12 DIAGNOSIS — H1013 Acute atopic conjunctivitis, bilateral: Secondary | ICD-10-CM

## 2013-08-12 DIAGNOSIS — F411 Generalized anxiety disorder: Secondary | ICD-10-CM

## 2013-08-12 DIAGNOSIS — J309 Allergic rhinitis, unspecified: Secondary | ICD-10-CM

## 2013-08-12 MED ORDER — AMOXICILLIN 500 MG PO CAPS: 500.0000 mg | ORAL_CAPSULE | Freq: Three times a day (TID) | ORAL | Status: DC

## 2013-08-12 MED ORDER — IPRATROPIUM BROMIDE 0.06 % NA SOLN: 2.0000 | Freq: Four times a day (QID) | NASAL | Status: DC

## 2013-08-12 NOTE — ED Notes (Signed)
Seen by physician prior to this nurse 

## 2013-08-12 NOTE — ED Provider Notes (Signed)
Nathan Cherry is a 52 y.o. male who presents to Urgent Care today for sore throat and congestion present for the last 2 days. Patient additionally notes chronic eye irritation. He has taken Pataday eyedrops in the past which were very effective. He denies any shortness of breath nausea vomiting or diarrhea. He has not tried any medications. He feels well otherwise. He notes some pain with swallowing. In the past he is taking amoxicillin which has been effective for this problem and he would like a refill if possible. No nausea vomiting or diarrhea.   Past Medical History  Diagnosis Date  . Low testosterone   . Hypertension   . Herpes    History  Substance Use Topics  . Smoking status: Never Smoker   . Smokeless tobacco: Not on file  . Alcohol Use: No   ROS as above Medications reviewed. No current facility-administered medications for this encounter.   Current Outpatient Prescriptions  Medication Sig Dispense Refill  . acyclovir (ZOVIRAX) 400 MG tablet Take 400 mg by mouth every 4 (four) hours while awake.      Marland Kitchen amoxicillin (AMOXIL) 500 MG capsule Take 1 capsule (500 mg total) by mouth 3 (three) times daily.  21 capsule  0  . Cetirizine HCl (ZYRTEC ALLERGY) 10 MG CAPS Take 1 capsule (10 mg total) by mouth daily at 8 pm.  30 capsule  1  . hydrocortisone (ANUSOL-HC) 25 MG suppository Place 1 suppository (25 mg total) rectally 2 (two) times daily.  30 suppository  1  . ibuprofen (ADVIL,MOTRIN) 600 MG tablet Take 1 tablet (600 mg total) by mouth every 6 (six) hours as needed for headache. Pain  30 tablet  0  . ipratropium (ATROVENT) 0.06 % nasal spray Place 2 sprays into both nostrils 4 (four) times daily.  15 mL  1  . metoprolol succinate (TOPROL-XL) 50 MG 24 hr tablet Take 50 mg by mouth daily. Take with or immediately following a meal.      . nystatin-triamcinolone ointment (MYCOLOG) Apply topically 2 (two) times daily.  30 g  1  . pantoprazole (PROTONIX) 40 MG tablet Take 40 mg  by mouth daily.      . valACYclovir (VALTREX) 500 MG tablet Take 500 mg by mouth 2 (two) times daily.        Exam:  BP 141/96  Pulse 84  Temp(Src) 97.9 F (36.6 C) (Oral)  Resp 16  SpO2 97% Gen: Well NAD HEENT: EOMI,  MMM normal-appearing eyes bilaterally. Posterior pharynx is mildly erythematous. Bilateral anterior cervical lymphadenopathy is present. Tympanic membranes are normal appearing bilaterally Lungs: Normal work of breathing. CTABL Heart: RRR no MRG Abd: NABS, Soft. NT, ND Exts: Non edematous BL  LE, warm and well perfused.     Assessment and Plan: 52 y.o. male with  1) pharyngitis. And use amoxicillin. Followup with primary care provider. Additionally will use Atrovent nasal spray 2) allergic conjunctivitis. Plan to use Zatidor eyedrops. Discussed warning signs or symptoms. Please see discharge instructions. Patient expresses understanding.      Rodolph Bong, MD 08/12/13 1455

## 2013-09-23 ENCOUNTER — Emergency Department (HOSPITAL_COMMUNITY)
Admission: EM | Admit: 2013-09-23 | Discharge: 2013-09-23 | Disposition: A | Discharge status: Home / Self Care | Payer: No Typology Code available for payment source | Source: Home / Self Care | Attending: Family Medicine | Admitting: Family Medicine

## 2013-09-23 ENCOUNTER — Encounter (HOSPITAL_COMMUNITY): Payer: Self-pay | Admitting: Emergency Medicine

## 2013-09-23 DIAGNOSIS — M542 Cervicalgia: Secondary | ICD-10-CM

## 2013-09-23 DIAGNOSIS — R21 Rash and other nonspecific skin eruption: Secondary | ICD-10-CM

## 2013-09-23 MED ORDER — TRIAMCINOLONE ACETONIDE 0.1 % EX CREA: 1.0000 "application " | TOPICAL_CREAM | Freq: Two times a day (BID) | CUTANEOUS | Status: DC

## 2013-09-23 MED ORDER — CYCLOBENZAPRINE HCL 10 MG PO TABS: 10.0000 mg | ORAL_TABLET | Freq: Every evening | ORAL | Status: DC | PRN

## 2013-09-23 MED ORDER — DICLOFENAC SODIUM 75 MG PO TBEC: 75.0000 mg | DELAYED_RELEASE_TABLET | Freq: Two times a day (BID) | ORAL | Status: DC | PRN

## 2013-09-23 NOTE — Discharge Instructions (Signed)
Thank you for coming in today. Use the cream on your belly.  Come back if not better.  Take voltaren for pain as needed.

## 2013-09-23 NOTE — ED Notes (Signed)
C/o neck pain and swelling onset 3 days ago. Voice hoarse.  C/o feels hot and has a fine red raised rash on torso with itching.  C/o pain on throat a little.

## 2013-09-23 NOTE — ED Provider Notes (Signed)
Nathan Cherry is a 53 y.o. male who presents to Urgent Care today for  1) posterior neck pain present for one week without injury. Patient notes pain at the bilateral trapezius worse on right than left. Pain does not radiate. Pain is worse with heavy lifting and activity. No radiating pain weakness or numbness. Patient has tried some over-the-counter pain medications which have helped.  2) additionally patient notes a fine rash across his abdomen is mildly itchy and present for about 3 days. He's tried some hydrocortisone cream which has helped. He has similar rash months ago which resolved with triamcinolone cream. No new creams or detergents or medications.  No fevers chills nausea vomiting or diarrhea. Patient works as a Psychiatristdrywall installer.    Past Medical History  Diagnosis Date  . Low testosterone   . Hypertension   . Herpes    History  Substance Use Topics  . Smoking status: Never Smoker   . Smokeless tobacco: Not on file  . Alcohol Use: No   ROS as above Medications reviewed. No current facility-administered medications for this encounter.   Current Outpatient Prescriptions  Medication Sig Dispense Refill  . acyclovir (ZOVIRAX) 400 MG tablet Take 400 mg by mouth every 4 (four) hours while awake.      . metoprolol succinate (TOPROL-XL) 50 MG 24 hr tablet Take 50 mg by mouth daily. Take with or immediately following a meal.      . pantoprazole (PROTONIX) 40 MG tablet Take 40 mg by mouth daily.      . cyclobenzaprine (FLEXERIL) 10 MG tablet Take 1 tablet (10 mg total) by mouth at bedtime as needed for muscle spasms. spanish  20 tablet  0  . diclofenac (VOLTAREN) 75 MG EC tablet Take 1 tablet (75 mg total) by mouth 2 (two) times daily as needed for moderate pain. spanish  60 tablet  0  . triamcinolone cream (KENALOG) 0.1 % Apply 1 application topically 2 (two) times daily. spanish  60 g  1  . [DISCONTINUED] Cetirizine HCl (ZYRTEC ALLERGY) 10 MG CAPS Take 1 capsule (10 mg  total) by mouth daily at 8 pm.  30 capsule  1  . [DISCONTINUED] ipratropium (ATROVENT) 0.06 % nasal spray Place 2 sprays into both nostrils 4 (four) times daily.  15 mL  1    Exam:  BP 137/87  Pulse 67  Temp(Src) 98.4 F (36.9 C) (Oral)  Resp 16  Ht 5\' 6"  (1.676 m)  Wt 210 lb (95.255 kg)  BMI 33.91 kg/m2  SpO2 97% Gen: Well NAD HEENT: EOMI,  MMM Lungs: Normal work of breathing. CTABL Heart: RRR no MRG Abd: NABS, Soft. NT, ND Exts: Non edematous BL  LE, warm and well perfused.  Neck: Nontender to spinal midline normal neck range of motion. Pain with bilateral lateral deviation. Musculature is well-developed. Mildly tender right cervical paraspinals and trapezius. No induration or erythema noted.  Skin:Fine lacy rash abdomen. Macular no papules nontender and  Blanchable. Not indurated. Some excoriations present    Assessment and Plan: 53 y.o. male with  1) cervical myofascial pain. Plan to treat with diclofenac and Flexeril. Additionally his home exercise program  2) contact dermatitis: Plan to treat with triamcinolone cream  Discussed warning signs or symptoms. Please see discharge instructions. Patient expresses understanding.    Rodolph BongEvan S Ladene Allocca, MD 09/23/13 [phone]

## 2013-10-07 ENCOUNTER — Encounter: Payer: Self-pay | Admitting: Internal Medicine

## 2013-10-07 ENCOUNTER — Ambulatory Visit: Payer: No Typology Code available for payment source | Attending: Internal Medicine | Admitting: Internal Medicine

## 2013-10-07 VITALS — BP 147/84 | HR 67 | Temp 98.7°F | Resp 16 | Wt 203.4 lb

## 2013-10-07 DIAGNOSIS — L708 Other acne: Secondary | ICD-10-CM

## 2013-10-07 DIAGNOSIS — L290 Pruritus ani: Secondary | ICD-10-CM

## 2013-10-07 DIAGNOSIS — I10 Essential (primary) hypertension: Secondary | ICD-10-CM

## 2013-10-07 DIAGNOSIS — M62838 Other muscle spasm: Secondary | ICD-10-CM

## 2013-10-07 DIAGNOSIS — L709 Acne, unspecified: Secondary | ICD-10-CM

## 2013-10-07 MED ORDER — TRIAMCINOLONE ACETONIDE 0.1 % EX CREA: 1.0000 "application " | TOPICAL_CREAM | Freq: Two times a day (BID) | CUTANEOUS | Status: DC

## 2013-10-07 MED ORDER — CYCLOBENZAPRINE HCL 10 MG PO TABS: 10.0000 mg | ORAL_TABLET | Freq: Every evening | ORAL | Status: DC | PRN

## 2013-10-07 MED ORDER — BENZOYL PEROXIDE 10 % EX CREA: TOPICAL_CREAM | CUTANEOUS | Status: DC

## 2013-10-07 NOTE — Progress Notes (Signed)
MRN: [id number] Name: Nathan Cherry  Sex: male Age: 53 y.o. DOB: Feb 12, 1960  Allergies: Doxycycline  Chief Complaint  Patient presents with  . Follow-up    HPI: Patient is 53 y.o. male who comes today for followup her has a strep hypertension today's blood pressure slightly elevated, is taking metoprolol, denies any headache dizziness, reported to have some neck pain/muscle spasm and went to the urgent care was prescribed Flexeril which as per patient and he is requesting refill on the medication, also history of anal pruritus ? Noticed a boil in the perianal area, denies any fever chills any change in bowel habits, as per patient he already had a colonoscopy done. Patient also complains of small spots on the face.  Past Medical History  Diagnosis Date  . Low testosterone   . Hypertension   . Herpes     History reviewed. No pertinent past surgical history.    Medication List       This list is accurate as of: 10/07/13  3:35 PM.  Always use your most recent med list.               acyclovir 400 MG tablet  Commonly known as:  ZOVIRAX  Take 400 mg by mouth every 4 (four) hours while awake.     Benzoyl Peroxide 10 % Crea  Commonly known as:  ACNE MAXIMUM STRENGTH  Apply once daily     cyclobenzaprine 10 MG tablet  Commonly known as:  FLEXERIL  Take 1 tablet (10 mg total) by mouth at bedtime as needed for muscle spasms. spanish     diclofenac 75 MG EC tablet  Commonly known as:  VOLTAREN  Take 1 tablet (75 mg total) by mouth 2 (two) times daily as needed for moderate pain. spanish     metoprolol succinate 50 MG 24 hr tablet  Commonly known as:  TOPROL-XL  Take 50 mg by mouth daily. Take with or immediately following a meal.     pantoprazole 40 MG tablet  Commonly known as:  PROTONIX  Take 40 mg by mouth daily.     triamcinolone cream 0.1 %  Commonly known as:  KENALOG  Apply 1 application topically 2 (two) times daily. spanish        Meds ordered this  encounter  Medications  . cyclobenzaprine (FLEXERIL) 10 MG tablet    Sig: Take 1 tablet (10 mg total) by mouth at bedtime as needed for muscle spasms. spanish    Dispense:  20 tablet    Refill:  0  . Benzoyl Peroxide (ACNE MAXIMUM STRENGTH) 10 % CREA    Sig: Apply once daily    Dispense:  30 g    Refill:  2  . triamcinolone cream (KENALOG) 0.1 %    Sig: Apply 1 application topically 2 (two) times daily. spanish    Dispense:  60 g    Refill:  1    Immunization History  Administered Date(s) Administered  . Influenza Split 06/16/2013  . Tdap 06/16/2013    History reviewed. No pertinent family history.  History  Substance Use Topics  . Smoking status: Never Smoker   . Smokeless tobacco: Not on file  . Alcohol Use: No    Review of Systems  As noted in HPI  Filed Vitals:   10/07/13 1511  BP: 147/84  Pulse: 67  Temp: 98.7 F (37.1 C)  Resp: 16    Physical Exam  Physical Exam  Constitutional:  acne spots on the  face  Cardiovascular: Normal rate and regular rhythm.   Pulmonary/Chest: Breath sounds normal. No respiratory distress. He has no wheezes.  Genitourinary:  Perianal examination did not reveal any boil    CBC    Component Value Date/Time   WBC 7.4 06/16/2013 1107   RBC 5.75 06/16/2013 1107   HGB 18.0* 06/16/2013 1107   HCT 49.8 06/16/2013 1107   PLT 362 06/16/2013 1107   MCV 86.6 06/16/2013 1107   LYMPHSABS 0.7 03/17/2013 2110   MONOABS 0.9 03/17/2013 2110   EOSABS 0.1 03/17/2013 2110   BASOSABS 0.0 03/17/2013 2110    CMP     Component Value Date/Time   NA 138 06/16/2013 1107   K 3.9 06/16/2013 1107   CL 102 06/16/2013 1107   CO2 29 06/16/2013 1107   GLUCOSE 80 06/16/2013 1107   BUN 11 06/16/2013 1107   CREATININE 0.95 06/16/2013 1107   CREATININE 1.03 03/17/2013 2110   CALCIUM 10.1 06/16/2013 1107   PROT 7.4 06/16/2013 1107   ALBUMIN 4.6 06/16/2013 1107   AST 29 06/16/2013 1107   ALT 56* 06/16/2013 1107   ALKPHOS 47 06/16/2013 1107   BILITOT 0.6 06/16/2013 1107    GFRNONAA 81* 03/17/2013 2110   GFRAA >90 03/17/2013 2110    Lab Results  Component Value Date/Time   CHOL 200 06/16/2013 11:07 AM    No components found with this basename: hga1c    Lab Results  Component Value Date/Time   AST 29 06/16/2013 11:07 AM    Assessment and Plan  Acne - Plan: Benzoyl Peroxide (ACNE MAXIMUM STRENGTH) 10 % CREA  Anal pruritus - Plan: triamcinolone cream (KENALOG) 0.1 %  HTN (hypertension) Advised patient for low salt diet.  Neck muscle spasm - Plan: cyclobenzaprine (FLEXERIL) 10 MG tablet    Return in about 3 months (around 01/05/2014).  Doris CheadleADVANI, Chianti Goh, MD

## 2013-10-07 NOTE — Progress Notes (Signed)
Patient here for follow up Complains of lump to the right side back of his neck Has a "boil" to the right side of his buttocks

## 2013-10-07 NOTE — Patient Instructions (Signed)
Dieta reducida en sodio, 2 gramos de sodio (2 Gram Low Sodium Diet) Esta dieta restringe la cantidad de sodio a no ms de 2 gramos o 2000 miligramos (mg) por da. La cantidad de sodio se limita para ayudar a disminuir la presin arterial y es importante para la insuficiencia cardaca y los problemas renales y hepticos. Muchos alimentos contienen sodio para mejorar el sabor y algunas veces como conservantes. Por lo tanto, cuando es necesario disminuir la cantidad de sodio en la dieta, es importante saber que productos hay que buscar cuando se eligen alimentos y bebidas. Puede demorar algn tiempo acostumbrarse a realizar cambios dietarios. Aqu se incluye informacin y una gua que lo ayudarn a adaptarse a una dieta reducida en sodio.  CONSEJOS PRCTICOS  No le agregue sal a los alimentos.  Evite los comidas de preparacin rpida que generalmente son elevadas en sodio.  Elija colaciones sin sal.  Compre productos con bajo contenido de sodio, en los que en su etiqueta diga: "bajo contenido de sodio" o "sin agregado de sal".  Verifique las etiquetas de los alimentos para conocer cunto sodio hay en una porcin.  Cuando coma en un restaurante, pida que preparen su comida con menos sal, y sin nada de sal en lo posible. LECTURA DE LAS ETIQUETAS DE LOS ALIMENTOS PARA INFORMARSE ACERCA DEL SODIO QUE CONTIENEN. La seccin Informacin Nutricional es un buen lugar en el que hallar cunta cantidad de sodio contienen los alimentos. Busque productos que no contengan ms de 500-600 mg/comida y no ms de 150 mg. por porcin Recuerde que 2 g = 2000 mg. La etiqueta de los alimentos tambin puede informar de ste modo:  Sin contenido de sodio: Menos de cinco mg. por porcin.  Muy bajo contenido de sodio: 35 mg o menos por porcin.  Bajo contenido de sodio: 140 mg o menos por porcin.  Bajo en sodio: 50% menos de sodio por porcin. Por ejemplo, si un alimento generalmente contiene 300 mg de sodio se  modifica para ser considerado bajo en sodio, tendr 150 mg de sodio.  Reducido en sodio: 25% menos de sodio por porcin. Por ejemplo, si un alimento que generalmente tiene 400 mg de sodio se modifica para ser reducido en sodio, tendr 300 mg de sodio. ELECCIN DE LOS ALIMENTOS Granos  Evite: Galletitas y colaciones saladas. Algunos cereales, incluyendo cereales calientes. Panificados y mezclas para bizcochos. Arroz condimentado o mezclas para pastas.  Elija: Colaciones sin sal. Cereales con bajo contenido de sodio, avena, arroz y trigo inflado, cereal para el desayuno. Pan y bollitos tipo Ingls. Pastas. Carnes  Evite: Saladas, en lata, ahumadas, condimentadas, en escabeche incluyendo pescado y pollo. Tocino, jamn, salchichas, fiambres, panchos, anchoas.  Elija: Atn y salmn enlatado con bajo contenido de sodio. Carne, pollo o pescado frescos o congelados. Lcteos  Evite: Quesos procesados y cremosos. Queso cottage. Manteca y leche condensada. Quesos comunes.  Elija: Leche. Queso cottage con bajo contenido de sodio. Yogur. Crema. Quesos con bajo contenido de sodio. Frutas y Vegetales  Evite: Vegetales comunes en lata. Salsa de tomate y pastas en lata. Vegetales en salsas congelados. Aceitunas. Pickles. Salsas. Chucrut.  Elija: Vegetales en lata con bajo contenido de sodio. Salsa de tomate y pastas con bajo contenido de sodio. Vegetales frescos o congelados. Frutas frescas y congeladas. Condimentos  Evite: Salsas en lata y envasadas. Salsa Worcestershire. Salsa trtara. Salsa Barbecue. Salsa de soja. Salsa de carne. Ketchup. Sal de cebolla, de ajo y sal de mesa. Saborizantes y tiernizantes para carne.  Elija:   Hierbas y especias frescas y secas. Variedades de mostaza y ketchup con bajo contenido de sodio. Jugo de limn. Salsa tabasco. Rbanos picantes. EJEMPLO DE PLAN ALIMENTARIO CON 2 GRAMOS DE SODIO Desayuno / Sodio (mg)  1 taza de leche descremada / 143 mg  2 rebanadas de pan  integral tostado / 270 mg  1 cucharada de margarina en tubo saludable para el corazn / 153 mg  1 huevo duro / 139 mg  1 naranja pequea / 0 mg Almuerzo / Sodio (mg)  1 taza de zanahorias crudas / 76 mg   taza de garbanzos / 298 mg  1 taza de leche descremada / 143 mg   taza de uvas negras / 2 mg  1 pan de pita de salvado / 356 mg Cena / Sodio (mg)  1 taza de pasta de salvado / 2 mg  1 taza de jugo de tomates bajo en sodio / 73 mg  3 onzas de carne picada magra / 57 mg  1 ensalada pequea (1 taza de espicanas crudas,  taza de pepinos,  taza de pimientos amarillos) con 1 cucharada de aceite de oliva y 1 cucharada de vinagre de vino / 25 mg Colacin / Sodio (mg)  1 pote de yogur de vainilla descremado / 107 mg  3 crackers de graham / 127 mg Anlisis nutricional  Caloras: 2033  Protenas: 77 g  Hidratos de carbono: 282 g  Grasa: 72 g  Sodio: 1.971 mg Document Released: 02/19/2006 Document Revised: 11/20/2011 ExitCare Patient Information 2014 ExitCare, LLC.  

## 2013-10-31 ENCOUNTER — Other Ambulatory Visit: Payer: Self-pay | Admitting: Internal Medicine

## 2013-11-15 ENCOUNTER — Other Ambulatory Visit: Payer: Self-pay

## 2013-11-15 MED ORDER — DICLOFENAC SODIUM 75 MG PO TBEC: 75.0000 mg | DELAYED_RELEASE_TABLET | Freq: Two times a day (BID) | ORAL | Status: DC | PRN

## 2013-11-17 ENCOUNTER — Emergency Department (HOSPITAL_COMMUNITY)
Admission: EM | Admit: 2013-11-17 | Discharge: 2013-11-17 | Disposition: A | Discharge status: Home / Self Care | Payer: No Typology Code available for payment source | Source: Home / Self Care | Attending: Family Medicine | Admitting: Family Medicine

## 2013-11-17 ENCOUNTER — Encounter (HOSPITAL_COMMUNITY): Payer: Self-pay | Admitting: Emergency Medicine

## 2013-11-17 DIAGNOSIS — H1045 Other chronic allergic conjunctivitis: Secondary | ICD-10-CM

## 2013-11-17 DIAGNOSIS — G8929 Other chronic pain: Secondary | ICD-10-CM

## 2013-11-17 DIAGNOSIS — H1013 Acute atopic conjunctivitis, bilateral: Secondary | ICD-10-CM

## 2013-11-17 DIAGNOSIS — M549 Dorsalgia, unspecified: Secondary | ICD-10-CM

## 2013-11-17 MED ORDER — CROMOLYN SODIUM 4 % OP SOLN: 2.0000 [drp] | Freq: Every day | OPHTHALMIC | Status: DC

## 2013-11-17 MED ORDER — DICLOFENAC SODIUM 75 MG PO TBEC: 75.0000 mg | DELAYED_RELEASE_TABLET | Freq: Two times a day (BID) | ORAL | Status: DC

## 2013-11-17 NOTE — ED Provider Notes (Signed)
Medical screening examination/treatment/procedure(s) were performed by resident physician or non-physician practitioner and as supervising physician I was immediately available for consultation/collaboration.   Katriona Schmierer DOUGLAS MD.   Ermelinda Eckert D Claressa Hughley, MD 11/17/13 2033 

## 2013-11-17 NOTE — ED Provider Notes (Signed)
CSN: [id number]     Arrival date & time 11/17/13  1520 History   First MD Initiated Contact with Patient 11/17/13 1621     Chief Complaint  Patient presents with  . Itchy Eye  . Back Pain   (Consider location/radiation/quality/duration/timing/severity/associated sxs/prior Treatment) HPI Comments: Reports hx of both allergic rhinitis (for which he states he uses an unknown prescription nasal spray) and allergic conjunctivitis in past. States over past 3 days he has developed itchy, watery, red eyes with clear rhinorrhea. Denies eye injury or contact lens use. No URI sx. No fever, no vision changes, no photophobia and no eye pain. No facial rashes.  Also requests medication that he uses on a daily basis for chronic back pain (voltaren).   Patient is a 53 y.o. male presenting with conjunctivitis. The history is provided by the patient.  Conjunctivitis This is a recurrent problem. Episode onset: 3 days ago. The problem occurs constantly. The problem has not changed since onset.   Past Medical History  Diagnosis Date  . Low testosterone   . Hypertension   . Herpes    History reviewed. No pertinent past surgical history. History reviewed. No pertinent family history. History  Substance Use Topics  . Smoking status: Never Smoker   . Smokeless tobacco: Not on file  . Alcohol Use: No    Review of Systems  Allergies  Doxycycline  Home Medications   Current Outpatient Rx  Name  Route  Sig  Dispense  Refill  . acyclovir (ZOVIRAX) 400 MG tablet   Oral   Take 400 mg by mouth every 4 (four) hours while awake.         . metoprolol succinate (TOPROL-XL) 50 MG 24 hr tablet   Oral   Take 50 mg by mouth daily. Take with or immediately following a meal.         . Benzoyl Peroxide (ACNE MAXIMUM STRENGTH) 10 % CREA      Apply once daily   30 g   2   . cromolyn (OPTICROM) 4 % ophthalmic solution   Both Eyes   Place 2 drops into both eyes 5 (five) times daily.   10 mL   0   .  cyclobenzaprine (FLEXERIL) 10 MG tablet      TAKE 1 TABLET BY MOUTH AT BEDTIME AS NEEDED FOR MUSCLE SPASMS.   20 tablet   0   . diclofenac (VOLTAREN) 75 MG EC tablet   Oral   Take 1 tablet (75 mg total) by mouth 2 (two) times daily as needed for moderate pain. spanish   60 tablet   0   . diclofenac (VOLTAREN) 75 MG EC tablet   Oral   Take 1 tablet (75 mg total) by mouth 2 (two) times daily.   20 tablet   0   . pantoprazole (PROTONIX) 40 MG tablet   Oral   Take 40 mg by mouth daily.         Marland Kitchen triamcinolone cream (KENALOG) 0.1 %   Topical   Apply 1 application topically 2 (two) times daily. spanish   60 g   1    BP 130/84  Pulse 72  Temp(Src) 98.4 F (36.9 C) (Oral)  Resp 18  SpO2 100% Physical Exam  Nursing note and vitals reviewed. Constitutional: He is oriented to person, place, and time. He appears well-developed and well-nourished. No distress.  HENT:  Head: Normocephalic and atraumatic.  Right Ear: External ear normal.  Left Ear: External ear  normal.  Nose: Nose normal.  Mouth/Throat: Oropharynx is clear and moist.  Eyes: EOM and lids are normal. Pupils are equal, round, and reactive to light. Right eye exhibits no discharge, no exudate and no hordeolum. No foreign body present in the right eye. Left eye exhibits no discharge, no exudate and no hordeolum. No foreign body present in the left eye. Right conjunctiva is injected. Right conjunctiva has no hemorrhage. Left conjunctiva is injected. Left conjunctiva has no hemorrhage.  Slit lamp exam:      The right eye shows no fluorescein uptake.       The left eye shows no fluorescein uptake.  Cardiovascular: Normal rate.   Pulmonary/Chest: Effort normal.  Musculoskeletal: Normal range of motion.  Neurological: He is alert and oriented to person, place, and time.  Skin: Skin is warm and dry. No rash noted.  Psychiatric: He has a normal mood and affect. His behavior is normal.    ED Course  Procedures  (including critical care time) Labs Review Labs Reviewed - No data to display Imaging Review No results found.   MDM   1. Allergic conjunctivitis of both eyes   2. Chronic back pain    Allergic Conjunctivitis: Will initiate treatment with Opticrom and advise PCP follow up if no improvement. Will provide limited Rx for voltaren and suggest that additional medication refills come from PCP.    Jess BartersJennifer Lee WilsonPresson, GeorgiaPA 11/17/13 [phone]

## 2013-11-17 NOTE — ED Notes (Signed)
C/o  Itchy, watery, eyes with mild pain.  No relief with otc eye drops  X 3 days.  upper back pain shoulder blade area.  Denies injury no meds tried.

## 2013-11-17 NOTE — Discharge Instructions (Signed)
Allergic Conjunctivitis  A thin membrane (conjunctiva) covers the eyeball and underside of the eyelids. Allergic conjunctivitis happens when the thin membrane gets irritated from things like animal dander, pollen, perfumes, or smoke (allergens). The membrane may become puffy (swollen) and red. Small bumps may form on the inside of the eyelids. Your eyes may get teary, itchy, or burn. It cannot be passed to another person (contagious).   HOME CARE  · Wash your hands before and after applying medicated drops or creams.  · Do not touch the drop or cream tube to your eye or eyelids.  · Do not use your soft contacts. Throw them away. Use a new pair once recovery is complete.  · Do not use your hard contacts. They need to be washed (sterilized) thoroughly after recovery is complete.  · Put a cold cloth to your eye(s) if you have itching and burning.  GET HELP RIGHT AWAY IF:   · You are not feeling better in 2 to 3 days after treatment.  · Your lids are sticky or stick together.  · Fluid comes from the eye(s).  · You become sensitive to light.  · You have a temperature by mouth above 102° F (38.9° C).  · You have pain in and around the eye(s).  · You start to have vision problems.  MAKE SURE YOU:   · Understand these instructions.  · Will watch your condition.  · Will get help right away if you are not doing well or get worse.  Document Released: 02/15/2010 Document Revised: 11/20/2011 Document Reviewed: 02/15/2010  ExitCare® Patient Information ©2014 ExitCare, LLC.

## 2014-01-02 ENCOUNTER — Ambulatory Visit: Payer: Self-pay | Admitting: Internal Medicine

## 2014-01-05 ENCOUNTER — Encounter: Payer: Self-pay | Admitting: Family Medicine

## 2014-01-05 ENCOUNTER — Ambulatory Visit: Payer: No Typology Code available for payment source | Attending: Family Medicine | Admitting: Family Medicine

## 2014-01-05 ENCOUNTER — Ambulatory Visit: Payer: Self-pay | Admitting: Internal Medicine

## 2014-01-05 VITALS — BP 145/91 | HR 80 | Temp 98.0°F | Resp 17 | Wt 205.4 lb

## 2014-01-05 DIAGNOSIS — R51 Headache: Secondary | ICD-10-CM | POA: Insufficient documentation

## 2014-01-05 DIAGNOSIS — G43909 Migraine, unspecified, not intractable, without status migrainosus: Secondary | ICD-10-CM

## 2014-01-05 MED ORDER — METOPROLOL SUCCINATE ER 50 MG PO TB24: 50.0000 mg | ORAL_TABLET | Freq: Every day | ORAL | Status: DC

## 2014-01-05 MED ORDER — SUMATRIPTAN SUCCINATE 50 MG PO TABS: 50.0000 mg | ORAL_TABLET | ORAL | Status: DC | PRN

## 2014-01-05 NOTE — Assessment & Plan Note (Signed)
Presentation consistent with migraine his preceding abdomen discomfort and vision blurring could be an aura.  He has tried nsaids for other pain and did not like them.  Will start with imitrex.  If needs regularly will need a preventer.  No signs of infection or intracrainial lesions

## 2014-01-05 NOTE — Patient Instructions (Signed)
Thank you for coming in today.  For Treatment you should:  Start taking your new medication called Imitrex  You should be better in couple hours after taking this medication.  If you have: A persistent headache with no relief  Or the headaches get worse You should contact our office  Come back in two months Cefalea migraosa (Migraine Headache) Una cefalea migraosa es un dolor intenso y punzante en uno o ambos lados de la cabeza. La migraa puede durar desde 30 minutos hasta varias horas. CAUSAS  No siempre se conoce la causa exacta de la cefalea migraosa. Sin embargo, IT consultantpueden aparecer Circuit Citycuando los nervios del cerebro se irritan y liberan ciertas sustancias qumicas que causan inflamacin. Esto ocasiona dolor. Existen tambin ciertos factores que pueden desencadenar las migraas, como los siguientes:  Alcohol.  Fumar.  Estrs.  La menstruacin  Quesos aejados.  Los alimentos o las bebidas que contienen nitratos, glutamato, aspartamo o tiramina.  Falta de sueo.  Chocolate.  Cafena.  Hambre.  Actividad fsica extenuante.  Fatiga.  Medicamentos que se usan para tratar Aeronautical engineerel dolor en el pecho (nitroglicerina), pldoras anticonceptivas, estrgeno y algunos medicamentos para la hipertensin arterial. SIGNOS Y SNTOMAS  Dolor en uno o ambos lados de la cabeza.  Dolor pulsante o punzante.  Dolor intenso que impide Ameren Corporationrealizar las actividades diarias.  Dolor que se agrava por cualquier actividad fsica.  Nuseas, vmitos o ambos.  Mareos.  Dolor con la exposicin a las luces brillantes, a los ruidos fuertes o la Hyannisactividad.  Sensibilidad general a las luces brillantes, a los ruidos fuertes o a los Limited Brandsolores. Antes de sufrir una migraa, puede recibir seales de advertencia (aura). Un aura puede incluir:  Ver las luces intermitentes.  Ver puntos brillantes, halos o lneas en zigzag.  Tener una visin en tnel o visin borrosa.  Sensacin de entumecimiento u  hormigueo.  Dificultad para hablar  Debilidad muscular. DIAGNSTICO  La cefalea migraosa se diagnostica en funcin de lo siguiente:  Sntomas.  Examen fsico.  Neomia DearUna TC (tomografa computada) o resonancia magntica de la cabeza. Estas pruebas de diagnstico por imagen no pueden diagnosticar las migraas, pero pueden ayudar a Sales promotion account executivedescartar otras causas de las cefaleas. TRATAMIENTO Le prescribirn medicamentos para Engineer, materialsaliviar el dolor y las nuseas. Tambin podrn administrarse medicamentos para ayudar a Armed forces training and education officerprevenir las migraas recurrentes.  INSTRUCCIONES PARA EL CUIDADO EN EL HOGAR  Slo tome medicamentos de venta libre o recetados para Primary school teachercalmar el dolor o Environmental health practitionerel malestar, segn las indicaciones de su mdico. No se recomienda usar los opiceos a Air cabin crewlargo plazo.  Cuando tenga la migraa, acustese en un cuarto oscuro y tranquilo  Lleve un registro diario para Financial risk analystaveriguar lo que puede provocar las Soil scientistcefaleas migraosas. Por ejemplo, escriba:  Lo que usted come y bebe.  Cunto tiempo duerme.  Algn cambio en su dieta o en los medicamentos.  Limite el consumo de bebidas alcohlicas.  Si fuma, deje de hacerlo.  Duerma entre 7 y 9horas, o segn las recomendaciones del mdico.  Limite el estrs.  Mantenga las luces tenues si le Goodrich Corporationmolestan las luces brillantes y la Brewermigraa empeora. SOLICITE ATENCIN MDICA DE INMEDIATO SI:   La migraa se hace cada vez ms intensa.  Tiene fiebre.  Presenta rigidez en el cuello.  Tiene prdida de visin.  Presenta debilidad muscular o prdida del control muscular.  Comienza a perder el equilibrio o tiene problemas para caminar.  Sufre mareos o se desmaya.  Tiene sntomas graves que son diferentes a los primeros sntomas. ASEGRESE DE QUE:  Comprende estas instrucciones.  Controlar su afeccin.  Recibir ayuda de inmediato si no mejora o si empeora. Document Released: 08/28/2005 Document Revised: 06/18/2013 [facility] LPExitCare Patient Information 2014 ChristineExitCare,  [place].

## 2014-01-05 NOTE — Progress Notes (Signed)
   Subjective:    Patient ID: Nathan Cherry, male    DOB: 11/26/1959, 53 y.o.   MRN: [id number]  HPI HEADACHE  Headache started last week more severe but has had similar headaches for years started in childhood Pain is on and off Severity:  severe Location: front and top of head Medications tried: tylenol Head trauma: denies Sudden onset: about one month ago Previous similar headaches: yes Taking blood thinners: no History of cancer: no  Symptoms Nose congestion stuffiness:  no Nausea vomiting: no Photophobia: not much mostly to head lights at night Noise sensitivity: yes Double vision or loss of vision: yes - blurring of vision with headache but no specific dble vision Fever: no Neck Stiffness: yes Trouble walking or speaking: no  Patient thinks cause of headache might be: he is not sure.  Daughter has migraines He suffered from similar headaches at the age of 53   Review of Symptoms - see HPI PMH - Smoking status noted.      Review of Systems     Objective:   Physical Exam Neurologic exam : Cn 2-7 intact Strength equal & normal in upper & lower extremities Able to walk on heels and toes.   Balance normal  Romberg normal, finger to nose   Eye - Pupils Equal Round Reactive to light, Extraocular movements intact, Fundi without hemorrhage or visible lesions, Conjunctiva without redness or discharge  Mouth - no lesions, mucous membranes are moist, no decaying teeth   Scalp nontender      Assessment & Plan:

## 2014-01-06 ENCOUNTER — Ambulatory Visit: Payer: Self-pay | Admitting: Family Medicine

## 2014-01-06 ENCOUNTER — Telehealth: Payer: Self-pay | Admitting: *Deleted

## 2014-01-06 NOTE — Telephone Encounter (Signed)
[facility] should be prescribed Metoprolol Tart or Metoprolol Succinate. Informed pharmacy that according to our records patient has been on Metoprolol Succinate 50 mg. Pharmacy verified prescription request was sent for Metoprolol Succinate 50 mg. Reather LaurenceJamie R Johnnie Goynes, RN

## 2014-01-20 ENCOUNTER — Ambulatory Visit: Payer: No Typology Code available for payment source | Attending: Internal Medicine

## 2014-02-05 ENCOUNTER — Encounter: Payer: Self-pay | Admitting: Internal Medicine

## 2014-02-05 ENCOUNTER — Ambulatory Visit: Payer: No Typology Code available for payment source | Attending: Internal Medicine | Admitting: Internal Medicine

## 2014-02-05 VITALS — BP 152/96 | HR 90 | Temp 98.0°F | Resp 16 | Ht 66.0 in | Wt 201.0 lb

## 2014-02-05 DIAGNOSIS — IMO0001 Reserved for inherently not codable concepts without codable children: Secondary | ICD-10-CM

## 2014-02-05 DIAGNOSIS — Z79899 Other long term (current) drug therapy: Secondary | ICD-10-CM | POA: Insufficient documentation

## 2014-02-05 DIAGNOSIS — R51 Headache: Secondary | ICD-10-CM | POA: Insufficient documentation

## 2014-02-05 DIAGNOSIS — N400 Enlarged prostate without lower urinary tract symptoms: Secondary | ICD-10-CM | POA: Insufficient documentation

## 2014-02-05 DIAGNOSIS — B009 Herpesviral infection, unspecified: Secondary | ICD-10-CM | POA: Insufficient documentation

## 2014-02-05 DIAGNOSIS — R350 Frequency of micturition: Secondary | ICD-10-CM

## 2014-02-05 DIAGNOSIS — H538 Other visual disturbances: Secondary | ICD-10-CM | POA: Insufficient documentation

## 2014-02-05 DIAGNOSIS — I10 Essential (primary) hypertension: Secondary | ICD-10-CM | POA: Insufficient documentation

## 2014-02-05 DIAGNOSIS — R109 Unspecified abdominal pain: Secondary | ICD-10-CM | POA: Insufficient documentation

## 2014-02-05 LAB — POCT URINALYSIS DIPSTICK
BILIRUBIN UA: NEGATIVE
Blood, UA: NEGATIVE
GLUCOSE UA: NEGATIVE
Ketones, UA: NEGATIVE
LEUKOCYTES UA: NEGATIVE
NITRITE UA: NEGATIVE
Protein, UA: NEGATIVE
Spec Grav, UA: <=1.005
UROBILINOGEN UA: 0.2
pH, UA: 7

## 2014-02-05 LAB — POCT GLYCOSYLATED HEMOGLOBIN (HGB A1C): Hemoglobin A1C: 5.1

## 2014-02-05 MED ORDER — OMEPRAZOLE 20 MG PO CPDR: 20.0000 mg | DELAYED_RELEASE_CAPSULE | Freq: Every day | ORAL | Status: DC

## 2014-02-05 NOTE — Progress Notes (Signed)
Patient ID: Nathan Cherry, male   DOB: [birth year], 53 y.o.   MRN: [id number]  CC: abdominal pain, headaches  HPI: Patient presents to clinic today for stomach pain.  Due to language barriers patient is unable to describe the pain.  He reports a irritation in his abdomen for the past three days.  He reports urinary frequency and urgency with occasional dysuria.  He denies hematuria, back pain, nausea, vomiting, fever, or chills.  He reports weak urine stream with feeling like he has not completely emptied his bladder after voiding.  He reports that he is voiding multiple times per night. Patient also complains of headaches with blurred vision for the past month.  He reports that he has been having daily headaches that last for 2 hours per episode.  His headaches begin in the frontal region and do not radiate.  He reports tingling in his legs.  He reports that he quit taking Imitrex because it made he feel weak and sleepy.    Allergies  Allergen Reactions  . Doxycycline Itching   Past Medical History  Diagnosis Date  . Low testosterone   . Hypertension   . Herpes    Current Outpatient Prescriptions on File Prior to Visit  Medication Sig Dispense Refill  . acyclovir (ZOVIRAX) 400 MG tablet Take 400 mg by mouth every 4 (four) hours while awake.      . cromolyn (OPTICROM) 4 % ophthalmic solution Place 2 drops into both eyes 5 (five) times daily.  10 mL  0  . cyclobenzaprine (FLEXERIL) 10 MG tablet TAKE 1 TABLET BY MOUTH AT BEDTIME AS NEEDED FOR MUSCLE SPASMS.  20 tablet  0  . diclofenac (VOLTAREN) 75 MG EC tablet Take 1 tablet (75 mg total) by mouth 2 (two) times daily as needed for moderate pain. spanish  60 tablet  0  . metoprolol succinate (TOPROL-XL) 50 MG 24 hr tablet Take 1 tablet (50 mg total) by mouth daily. Take with or immediately following a meal.  30 tablet  0  . pantoprazole (PROTONIX) 40 MG tablet Take 40 mg by mouth daily.      . SUMAtriptan (IMITREX) 50 MG tablet Take 1  tablet (50 mg total) by mouth every 2 (two) hours as needed for migraine or headache. May repeat in 2 hours if headache persists or recurs.  10 tablet  1  . triamcinolone cream (KENALOG) 0.1 % Apply 1 application topically 2 (two) times daily. spanish  60 g  1  . Benzoyl Peroxide (ACNE MAXIMUM STRENGTH) 10 % CREA Apply once daily  30 g  2  . diclofenac (VOLTAREN) 75 MG EC tablet Take 1 tablet (75 mg total) by mouth 2 (two) times daily.  20 tablet  0  . [DISCONTINUED] Cetirizine HCl (ZYRTEC ALLERGY) 10 MG CAPS Take 1 capsule (10 mg total) by mouth daily at 8 pm.  30 capsule  1  . [DISCONTINUED] ipratropium (ATROVENT) 0.06 % nasal spray Place 2 sprays into both nostrils 4 (four) times daily.  15 mL  1   No current facility-administered medications on file prior to visit.   History reviewed. No pertinent family history. History   Social History  . Marital Status: Single    Spouse Name: N/A    Number of Children: N/A  . Years of Education: N/A   Occupational History  . Not on file.   Social History Main Topics  . Smoking status: Never Smoker   . Smokeless tobacco: Not on file  .  Alcohol Use: No  . Drug Use: No  . Sexual Activity: Yes    Birth Control/ Protection: Condom   Other Topics Concern  . Not on file   Social History Narrative  . No narrative on file    Review of Systems: See HPI  Objective:   Filed Vitals:   02/05/14 1416  BP: 152/96  Pulse: 90  Temp: 98 F (36.7 C)  Resp: 16   Physical Exam  Vitals reviewed. Constitutional: He is oriented to person, place, and time. He appears well-nourished.  HENT:  Right Ear: External ear normal.  Left Ear: External ear normal.  Mouth/Throat: Oropharynx is clear and moist.  Eyes: Conjunctivae and EOM are normal. Pupils are equal, round, and reactive to light.  Neck: Normal range of motion.  Cardiovascular: Normal rate, regular rhythm and normal heart sounds.   Pulmonary/Chest: Effort normal and breath sounds normal.   Abdominal: Soft.  Genitourinary:  Unable to reach prostate to feel for enlargement   Musculoskeletal: Normal range of motion.  Neurological: He is alert and oriented to person, place, and time. He has normal reflexes. No cranial nerve deficit.  Skin: Skin is warm and dry.  Psychiatric: He has a normal mood and affect. His behavior is normal.     Lab Results  Component Value Date   WBC 7.4 06/16/2013   HGB 18.0* 06/16/2013   HCT 49.8 06/16/2013   MCV 86.6 06/16/2013   PLT 362 06/16/2013   Lab Results  Component Value Date   CREATININE 0.95 06/16/2013   BUN 11 06/16/2013   NA 138 06/16/2013   K 3.9 06/16/2013   CL 102 06/16/2013   CO2 29 06/16/2013    No results found for this basename: HGBA1C   Lipid Panel     Component Value Date/Time   CHOL 200 06/16/2013 1107   TRIG 203* 06/16/2013 1107   HDL 42 06/16/2013 1107   CHOLHDL 4.8 06/16/2013 1107   VLDL 41* 06/16/2013 1107   LDLCALC 117* 06/16/2013 1107       Assessment and plan:  Nathan Cherry was seen today for follow-up.  Diagnoses and associated orders for this visit:  BPH (benign prostatic hyperplasia) Will start Flomax if PSA is normal. - PSA - POCT glycosylated hemoglobin (Hb A1C) - Thyroid Panel With TSH - POCT urinalysis dipstick  Abdominal pain, other specified site - HELICOBACTER PYLORI  ANTIBODY, IGM Very vague symptoms, unclear of etiology - omeprazole (PRILOSEC) 20 MG capsule; Take 1 capsule (20 mg total) by mouth daily. Take 30 minutes before breakfast  Return in about 2 weeks (around 02/19/2014) for with [facility] for prostate check. Unable to reach prostate on exam, will need to f/u with [facility] for a recheck. Will check PSA.   Interpreter was used to communicate directly with patient for the entire encounter including providing detailed patient instructions.    Ambrose Finland, NP-C [facility] [phone] 02/09/2014, 11:21 PM

## 2014-02-05 NOTE — Patient Instructions (Signed)
Reflujo gastroesofágico - Adultos   (Gastroesophageal Reflux Disease, Adult)   El reflujo gastroesofágico ocurre cuando el ácido del estómago pasa al esófago. Cuando el ácido entra en contacto con el esófago, el ácido provoca dolor (inflamación) en el esófago. Con el tiempo, pueden formarse pequeños agujeros (úlceras) en el revestimiento del esófago.  CAUSAS   · Exceso de peso corporal. Esto aplica presión sobre el estómago, lo que hace que el ácido del estómago suba hacia el esófago.  · El hábito de fumar Aumenta la producción de ácido en el estómago.  · El consumo de alcohol. Provoca disminución de la presión en el esfínter esofágico inferior (válvula o anillo de músculo entre el esófago y el estómago), permitiendo que el ácido del estómago suba hacia el esófago.  · Cenas a última hora del día y estómago lleno. Aumenta la presión y la producción de ácido en el estómago.  · Malformación en el esfínter esofágico inferior.  A menudo no se halla causa.   SÍNTOMAS   · Ardor y dolor en la parte inferior del pecho detrás del esternón y en la zona media del estómago. Puede ocurrir dos veces por semana o más a menudo.  · Dificultad para tragar.  · Dolor de garganta.  · Tos seca.  · Síntomas similares al asma que incluyen sensación de opresión en el pecho, falta de aire y sibilancias.  DIAGNÓSTICO   El médico diagnosticará el problema basándose en los síntomas. En algunos casos, se indican radiografías y otras pruebas para verificar si hay complicaciones o para comprobar el estado del estómago y el esófago.   TRATAMIENTO   El médico le indicará medicamentos de venta libre o recetados para ayudar a disminuir la producción de ácido. Consulte con su médico antes de empezar o agregar cualquier medicamento nuevo.   INSTRUCCIONES PARA EL CUIDADO EN EL HOGAR   · Modifique los factores que pueda cambiar. Consulte con su médico para solicitar orientación relacionada con la pérdida de peso, dejar de fumar y el consumo de  alcohol.  · Evite las comidas y bebidas que empeoran los problemas, como:  · Bebidas con cafeína o alcohólicas.  · Chocolate.  · Sabores a menta.  · Ajo y cebolla.  · Comidas muy condimentadas.  · Cítricos como naranjas, limones o limas.  · Alimentos que contengan tomate, como salsas, chile y pizza.  · Alimentos fritos y grasos.  · Evite acostarse durante 3 horas antes de irse a dormir o antes de tomar una siesta.  · Haga comidas pequeñas durante el día en lugar de 3 comidas abundantes.  · Use ropas sueltas. No use nada apretado alrededor de la cintura que cause presión en el estómago.  · Levante (eleve) la cabecera de la cama 6 a 8 pulgadas (15 a 20 cm) con bloques de madera. Usar almohadas extra no ayuda.  · Solo tome medicamentos que se pueden comprar sin receta o recetados para el dolor, malestar o fiebre, como le indica el médico.  · No tome aspirina, ibuprofeno ni antiinflamatorios no esteroides.  SOLICITE ATENCIÓN MÉDICA DE INMEDIATO SI:   · Siente dolor en los brazos, el cuello, la mandíbula, los dientes o la espalda.  · El dolor aumenta o cambia la intensidad o la duranción.  · Tiene náuseas, vómitos o sudoración(diaforesis).  · Siente falta de aire o dolor en el pecho, o se desmaya.  · Vomita y el vómito tiene sangre, es de color verde, amarillo, negro o es similar a la borra del   café o tiene sangre.  · Las heces son rojas, sanguinolentas o negras.  Estos síntomas pueden ser signos de otros problemas, como enfermedades cardíacas, hemorragias gástrias o sangrado esofágico.   ASEGÚRESE DE QUE:   · Comprende estas instrucciones.  · Controlará su enfermedad.  · Solicitará ayuda de inmediato si no mejora o si empeora.  Document Released: 06/07/2005 Document Revised: 11/20/2011  ExitCare® Patient Information ©2014 ExitCare, LLC.

## 2014-02-05 NOTE — Progress Notes (Signed)
Pt reports that 3 days ago he is uncomfortable around his abdomen w/ feelings of fullness and hunger.  Pt has frequent headaches. He also said that his hands get cold and shaky.

## 2014-02-06 ENCOUNTER — Telehealth: Payer: Self-pay | Admitting: Emergency Medicine

## 2014-02-06 LAB — THYROID PANEL WITH TSH
Free Thyroxine Index: 3.2 (ref 1.0–3.9)
T3 Uptake: 31.9 % (ref 22.5–37.0)
T4, Total: 9.9 ug/dL (ref 5.0–12.5)
TSH: 1.336 u[IU]/mL (ref 0.350–4.500)

## 2014-02-06 LAB — PSA: PSA: 0.31 ng/mL (ref <=4.00)

## 2014-02-06 LAB — HELICOBACTER PYLORI  ANTIBODY, IGM: HELICOBACTER PYLORI, IGM: 5.7 U/mL (ref <9.0)

## 2014-02-06 NOTE — Telephone Encounter (Signed)
Pt given normal lab results. States medication Omeprazole is effective for gas pains

## 2014-02-09 MED ORDER — TAMSULOSIN HCL 0.4 MG PO CAPS: 0.4000 mg | ORAL_CAPSULE | Freq: Every day | ORAL | Status: DC

## 2014-02-10 ENCOUNTER — Telehealth: Payer: Self-pay | Admitting: Emergency Medicine

## 2014-02-10 NOTE — Telephone Encounter (Signed)
Pt given lab results via pacific interpretor. Instructed pt to start taking prescribed medication Flomax daily which will help with urine stream. Pt verbalized understanding

## 2014-02-16 ENCOUNTER — Encounter: Payer: Self-pay | Admitting: Internal Medicine

## 2014-02-16 ENCOUNTER — Ambulatory Visit: Payer: No Typology Code available for payment source | Attending: Internal Medicine | Admitting: Internal Medicine

## 2014-02-16 VITALS — BP 147/107 | HR 102 | Temp 98.9°F | Resp 14 | Ht 66.0 in | Wt 207.0 lb

## 2014-02-16 DIAGNOSIS — B009 Herpesviral infection, unspecified: Secondary | ICD-10-CM | POA: Insufficient documentation

## 2014-02-16 DIAGNOSIS — E291 Testicular hypofunction: Secondary | ICD-10-CM | POA: Insufficient documentation

## 2014-02-16 DIAGNOSIS — M62838 Other muscle spasm: Secondary | ICD-10-CM | POA: Insufficient documentation

## 2014-02-16 DIAGNOSIS — F411 Generalized anxiety disorder: Secondary | ICD-10-CM | POA: Insufficient documentation

## 2014-02-16 DIAGNOSIS — I10 Essential (primary) hypertension: Secondary | ICD-10-CM | POA: Insufficient documentation

## 2014-02-16 DIAGNOSIS — R7989 Other specified abnormal findings of blood chemistry: Secondary | ICD-10-CM

## 2014-02-16 DIAGNOSIS — R109 Unspecified abdominal pain: Secondary | ICD-10-CM | POA: Insufficient documentation

## 2014-02-16 MED ORDER — ALPRAZOLAM 1 MG PO TABS: 1.0000 mg | ORAL_TABLET | Freq: Every evening | ORAL | Status: DC | PRN

## 2014-02-16 MED ORDER — CYCLOBENZAPRINE HCL 10 MG PO TABS: 10.0000 mg | ORAL_TABLET | Freq: Three times a day (TID) | ORAL | Status: DC | PRN

## 2014-02-16 NOTE — Patient Instructions (Signed)
Trastorno de ansiedad generalizada  (Generalized Anxiety Disorder) El trastorno de ansiedad generalizada es un trastorno mental. Interfiere en las funciones vitales, incluyendo las [place], el trabajo y la escuela.  Es diferente de la ansiedad normal que todas las personas experimentan en algn momento de su vida en respuesta a sucesos y Chief Operating Officer. En verdad, la ansiedad normal nos ayuda a prepararnos y Human resources officer acontecimientos y actividades de la vida. La ansiedad normal desaparece despus de que el evento o la actividad ha finalizado.  El trastorno de ansiedad generalizada no est necesariamente relacionada con eventos o actividades especficas. Tambin causa un exceso de ansiedad en proporcin a sucesos o actividades especficas. En este trastorno la ansiedad es difcil de Chief Operating Officer. Los sntomas pueden variar de leves a muy graves. Las personas que sufren de trastorno de ansiedad generalizada pueden tener intensas olas de ansiedad con sntomas fsicos (ataques de pnico).  SNTOMAS  La ansiedad y la preocupacin asociada a este trastorno son difciles de Chief Operating Officer. Esta ansiedad y la preocupacin estn relacionados con muchos eventos de la vida y sus actividades y tambin ocurre durante ms [employer] que no ocurre, durante 6 meses o ms. Las personas que la sufren pueden tener tres o ms de los siguientes sntomas (uno o ms en los nios):   Glass blower/designer.  Dificultades de concentracin.   Irritabilidad.  Tensin muscular  Dificultad para dormirse o sueo poco satisfactorio. DIAGNSTICO  Se diagnostica a travs de una evaluacin realizada por el mdico. El mdico le har preguntas acerca de su estado de nimo, sntomas fsicos y sucesos de [place] vida. Le har preguntas sobre su historia clnica, el consumo de alcohol o drogas, incluyendo los medicamentos recetados. [employer] un examen fsico e indicar anlisis de [place]. Ciertas enfermedades y el uso de  determinadas sustancias pueden causar sntomas similares a este trastorno. Su mdico lo puede derivar a Music therapist en salud mental para una evaluacin ms profunda.Nathan Cherry  Las terapias siguientes se utilizan en el tratamiento de este trastorno:   Medicamentos - Se recetan antidepresivos para el control diario a Air cabin crew. Pueden indicarse tambin medicamentos para combatir la [employer] graves, especialmente cuando ocurren ataques de pnico.   Terapia conversada (psicoterapia) Ciertos tipos de psicoterapia pueden ser tiles en el tratamiento del trastorno de ansiedad generalizada, proporcionando apoyo, educacin y Optometrist. Una forma de psicoterapia llamada terapia cognitivo-conductual puede ensearle formas saludables de pensar y Publishing rights manager a los eventos y actividades de la vida diaria.  Tcnicasde manejo del estrs- Estas tcnicas incluyen el yoga, la meditacin y el ejercicio y pueden ser muy tiles cuando se practican con regularidad. Un especialista en salud mental puede ayudar a determinar qu tratamiento es mejor para usted. Algunas personas obtienen mejora con una terapia. Sin embargo, Economist requieren una combinacin de terapias.  Document Released: 12/23/2012 [facility] Patient Information 2014 [place], [place].

## 2014-02-16 NOTE — Progress Notes (Signed)
Patient ID: Nathan Cherry, male   DOB: 11/26/1959, 53 y.o.   MRN: [id number]   Mohammed Langham, is a 53 y.o. male  [id number]  [id number]  DOB - 11/26/1959  Chief Complaint  Patient presents with  . Follow-up        Subjective:   Nathan Cherry is a 53 y.o. male here today for a follow up visit. Patient has history of hypertension, herpes, generalized anxiety disorder, and low testosterone here today to have his prostate checked, he also reports having frequent headaches which has been chronic in nature, not different in character or intensity, mostly at the occipital area, often occurs with stress. Patient had been seen here a week ago had some lab tests were ordered, patient is here to discuss the result. There is really no new complaints today. Patient has No headache, No chest pain, No abdominal pain - No Nausea, No new weakness tingling or numbness, No Cough - SOB.  Problem  Neck Muscle Spasm  Abdominal Pain, Other Specified Site  Low Testosterone    ALLERGIES: Allergies  Allergen Reactions  . Doxycycline Itching    PAST MEDICAL HISTORY: Past Medical History  Diagnosis Date  . Low testosterone   . Hypertension   . Herpes     MEDICATIONS AT HOME: Prior to Admission medications   Medication Sig Start Date End Date Taking? Authorizing Provider  ALPRAZolam Prudy Feeler) 1 MG tablet Take 1 tablet (1 mg total) by mouth at bedtime as needed for anxiety. 02/16/14  Yes Jeanann Lewandowsky, MD  metoprolol succinate (TOPROL-XL) 50 MG 24 hr tablet Take 1 tablet (50 mg total) by mouth daily. Take with or immediately following a meal. 01/05/14  Yes Carney Living, MD  omeprazole (PRILOSEC) 20 MG capsule Take 1 capsule (20 mg total) by mouth daily. Take 30 minutes before breakfast 02/05/14  Yes Ambrose Finland, NP  acyclovir (ZOVIRAX) 400 MG tablet Take 400 mg by mouth every 4 (four) hours while awake.    Historical Provider, MD  Benzoyl Peroxide (ACNE MAXIMUM  STRENGTH) 10 % CREA Apply once daily 10/07/13   Doris Cheadle, MD  cromolyn (OPTICROM) 4 % ophthalmic solution Place 2 drops into both eyes 5 (five) times daily. 11/17/13   Ardis Rowan, PA  cyclobenzaprine (FLEXERIL) 10 MG tablet Take 1 tablet (10 mg total) by mouth 3 (three) times daily as needed for muscle spasms. 02/16/14   Jeanann Lewandowsky, MD  diclofenac (VOLTAREN) 75 MG EC tablet Take 1 tablet (75 mg total) by mouth 2 (two) times daily. 11/17/13   Ardis Rowan, PA  SUMAtriptan (IMITREX) 50 MG tablet Take 1 tablet (50 mg total) by mouth every 2 (two) hours as needed for migraine or headache. May repeat in 2 hours if headache persists or recurs. 01/05/14   Carney Living, MD  tamsulosin (FLOMAX) 0.4 MG CAPS capsule Take 1 capsule (0.4 mg total) by mouth daily. 02/09/14   Ambrose Finland, NP  triamcinolone cream (KENALOG) 0.1 % Apply 1 application topically 2 (two) times daily. spanish 10/07/13   Doris Cheadle, MD     Objective:   Filed Vitals:   02/16/14 1632  BP: 147/107  Pulse: 102  Temp: 98.9 F (37.2 C)  TempSrc: Oral  Resp: 14  Height: 5\' 6"  (1.676 m)  Weight: 207 lb (93.895 kg)  SpO2: 95%    Exam General appearance : Awake, alert, not in any distress. Speech Clear. Not toxic looking HEENT: Atraumatic and Normocephalic, pupils equally reactive to  light and accomodation Neck: supple, no JVD. No cervical lymphadenopathy.  Chest:Good air entry bilaterally, no added sounds  CVS: S1 S2 regular, no murmurs.  Abdomen: Bowel sounds present, Non tender and not distended with no gaurding, rigidity or rebound. Extremities: B/L Lower Ext shows no edema, both legs are warm to touch Neurology: Awake alert, and oriented X 3, CN II-XII intact, Non focal   Data Review Lab Results  Component Value Date   HGBA1C 5.1 02/05/2014     Assessment & Plan   Blood pressure was repeated and it was 135/90 mmHg Patient will continue current medications for hypertension.  1.  Abdominal pain, other specified site  - Uric Acid  2. Neck muscle spasm  - cyclobenzaprine (FLEXERIL) 10 MG tablet; Take 1 tablet (10 mg total) by mouth 3 (three) times daily as needed for muscle spasms.  Dispense: 30 tablet; Refill: 0  3. Generalized anxiety disorder   ALPRAZolam (XANAX) 1 MG tablet; Take 1 tablet (1 mg total) by mouth at bedtime as needed for anxiety.  Dispense: 30 tablet; Refill: 0 Follow behavioral therapist as scheduled  4. Low testosterone  - Testosterone injection prescribed Patient was given education on the use of testosterone on its likely side effects  Patient was counseled extensively about nutrition and exercise   Return in about 6 months (around 08/18/2014), or if symptoms worsen or fail to improve, for Abdominal Pain, Follow up HTN.  The patient was given clear instructions to go to ER or return to medical center if symptoms don't improve, worsen or new problems develop. The patient verbalized understanding. The patient was told to call to get lab results if they haven't heard anything in the next week.   This note has been created with Education officer, environmentalDragon speech recognition software and smart phrase technology. Any transcriptional errors are unintentional.    Jeanann Lewandowskylugbemiga Kentrel Clevenger, MD, MHA, FACP, [facility]allenFAAP [facility] [place], [place] [phone]   02/16/2014, 5:12 PM

## 2014-02-16 NOTE — Progress Notes (Signed)
Pt is here today to have his prostate checked. Pt reports having headaches and panic attacks. Pt is requesting to review his lab results

## 2014-02-17 LAB — URIC ACID: Uric Acid, Serum: 6.9 mg/dL (ref 4.0–7.8)

## 2014-02-17 LAB — TESTOSTERONE: TESTOSTERONE: 232 ng/dL — AB (ref 300–890)

## 2014-02-19 ENCOUNTER — Telehealth: Payer: Self-pay | Admitting: *Deleted

## 2014-02-19 NOTE — Telephone Encounter (Signed)
Message copied by Raynelle Chary on Thu Feb 19, 2014  2:59 PM ------      Message from: Jeanann Lewandowsky E      Created: Wed Feb 18, 2014 12:00 PM       Please inform patient that his uric acid level is normal, his testosterone level is slightly low. He may discuss options of testosterone injection during his next visit. ------

## 2014-02-19 NOTE — Telephone Encounter (Signed)
Left message to return my call.  

## 2014-02-23 ENCOUNTER — Telehealth: Payer: Self-pay | Admitting: Internal Medicine

## 2014-02-23 NOTE — Telephone Encounter (Signed)
Pt came in person because missed nurse call regarding results, pls f/u with pt.

## 2014-02-24 ENCOUNTER — Telehealth: Payer: Self-pay | Admitting: *Deleted

## 2014-02-24 ENCOUNTER — Telehealth: Payer: Self-pay | Admitting: Emergency Medicine

## 2014-02-24 NOTE — Telephone Encounter (Signed)
Attempted to reach pt to give lab results and new instructions to discuss Testosterone injections at next scheduled appt.

## 2014-02-24 NOTE — Telephone Encounter (Signed)
Left message to return my call.  

## 2014-03-25 ENCOUNTER — Telehealth: Payer: Self-pay | Admitting: *Deleted

## 2014-03-25 NOTE — Telephone Encounter (Signed)
Patient called for test results. Informed patient his uric acid level is normal, his testosterone level is slightly low. He may discuss options of testosterone injection during his next visit. Patient wants to know if he can come in for lipid panel and have sooner appt than 08/2014 to discuss testosterone injection options. Annamaria Hellingose,Tareek Sabo Renee, RN

## 2014-03-25 NOTE — Telephone Encounter (Signed)
Let patient know we are booked until his next appointment. At that time he can come back fasting and we may draw a lipid panel then.  Patient has the options to get monthly Testerone injections but I would wait so that we may discuss side effects of medication.

## 2014-03-30 ENCOUNTER — Ambulatory Visit: Payer: No Typology Code available for payment source | Attending: Internal Medicine

## 2014-03-30 ENCOUNTER — Other Ambulatory Visit: Payer: Self-pay | Admitting: Family Medicine

## 2014-03-30 ENCOUNTER — Telehealth: Payer: Self-pay | Admitting: Emergency Medicine

## 2014-03-30 ENCOUNTER — Telehealth: Payer: Self-pay | Admitting: Internal Medicine

## 2014-03-30 DIAGNOSIS — Z0000 Encounter for general adult medical examination without abnormal findings: Secondary | ICD-10-CM

## 2014-03-30 LAB — LIPID PANEL
CHOLESTEROL: 188 mg/dL (ref 0–200)
HDL: 41 mg/dL (ref >39)
LDL Cholesterol: 108 mg/dL — ABNORMAL HIGH (ref 0–99)
Total CHOL/HDL Ratio: 4.6 Ratio
Triglycerides: 197 mg/dL — ABNORMAL HIGH (ref <150)
VLDL: 39 mg/dL (ref 0–40)

## 2014-03-30 NOTE — Telephone Encounter (Signed)
May send refill 

## 2014-03-30 NOTE — Telephone Encounter (Signed)
Pt requesting refill for Anucort-HC mg supp. Pt uses [facility] pharmacy, pls f/u with pt when script has been sent.

## 2014-03-30 NOTE — Telephone Encounter (Signed)
Pt requesting medication Anucort-HC mg supp. Please f/u

## 2014-04-15 ENCOUNTER — Telehealth: Payer: Self-pay | Admitting: Internal Medicine

## 2014-04-15 NOTE — Telephone Encounter (Signed)
Pt would like to know Lab results from last month. Please f/u with Pt.

## 2014-05-21 ENCOUNTER — Telehealth: Payer: Self-pay | Admitting: *Deleted

## 2014-05-21 NOTE — Telephone Encounter (Signed)
informed

## 2014-05-28 ENCOUNTER — Encounter: Payer: Self-pay | Admitting: Internal Medicine

## 2014-05-28 ENCOUNTER — Ambulatory Visit: Payer: No Typology Code available for payment source | Attending: Internal Medicine | Admitting: Internal Medicine

## 2014-05-28 VITALS — BP 123/88 | HR 101 | Temp 99.0°F | Resp 14 | Ht 66.0 in | Wt 214.0 lb

## 2014-05-28 DIAGNOSIS — Z79899 Other long term (current) drug therapy: Secondary | ICD-10-CM | POA: Insufficient documentation

## 2014-05-28 DIAGNOSIS — I10 Essential (primary) hypertension: Secondary | ICD-10-CM | POA: Insufficient documentation

## 2014-05-28 DIAGNOSIS — E291 Testicular hypofunction: Secondary | ICD-10-CM | POA: Insufficient documentation

## 2014-05-28 DIAGNOSIS — K0510 Chronic gingivitis, plaque induced: Secondary | ICD-10-CM

## 2014-05-28 DIAGNOSIS — B009 Herpesviral infection, unspecified: Secondary | ICD-10-CM | POA: Insufficient documentation

## 2014-05-28 DIAGNOSIS — F411 Generalized anxiety disorder: Secondary | ICD-10-CM | POA: Insufficient documentation

## 2014-05-28 DIAGNOSIS — R7989 Other specified abnormal findings of blood chemistry: Secondary | ICD-10-CM

## 2014-05-28 MED ORDER — ACYCLOVIR 400 MG PO TABS: 400.0000 mg | ORAL_TABLET | ORAL | Status: DC

## 2014-05-28 MED ORDER — ALPRAZOLAM 1 MG PO TABS: 1.0000 mg | ORAL_TABLET | Freq: Every evening | ORAL | Status: DC | PRN

## 2014-05-28 MED ORDER — ACYCLOVIR 400 MG PO TABS: 400.0000 mg | ORAL_TABLET | Freq: Every day | ORAL | Status: DC

## 2014-05-28 MED ORDER — TESTOSTERONE CYPIONATE 200 MG/ML IM SOLN: 200.0000 mg | INTRAMUSCULAR | Status: DC

## 2014-05-28 NOTE — Patient Instructions (Signed)
Erectile Dysfunction Erectile dysfunction is the inability to get or sustain a good enough erection to have sexual intercourse. Erectile dysfunction may involve:  Inability to get an erection.  Lack of enough hardness to allow penetration.  Loss of the erection before sex is finished.  Premature ejaculation. CAUSES  Certain drugs, such as:  Pain relievers.  Antihistamines.  Antidepressants.  Blood pressure medicines.  Water pills (diuretics).  Ulcer medicines.  Muscle relaxants.  Illegal drugs.  Excessive drinking.  Psychological causes, such as:  Anxiety.  Depression.  Sadness.  Exhaustion.  Performance fear.  Stress.  Physical causes, such as:  Artery problems. This may include diabetes, smoking, liver disease, or atherosclerosis.  High blood pressure.  Hormonal problems, such as low testosterone.  Obesity.  Nerve problems. This may include back or pelvic injuries, diabetes mellitus, multiple sclerosis, or Parkinson disease. SYMPTOMS  Inability to get an erection.  Lack of enough hardness to allow penetration.  Loss of the erection before sex is finished.  Premature ejaculation.  Normal erections at some times, but with frequent unsatisfactory episodes.  Orgasms that are not satisfactory in sensation or frequency.  Low sexual satisfaction in either partner because of erection problems.  A curved penis occurring with erection. The curve may cause pain or may be too curved to allow for intercourse.  Never having nighttime erections. DIAGNOSIS Your caregiver can often diagnose this condition by:  Performing a physical exam to find other diseases or specific problems with the penis.  Asking you detailed questions about the problem.  Performing blood tests to check for diabetes mellitus or to measure hormone levels.  Performing urine tests to find other underlying health conditions.  Performing an ultrasound exam to check for  scarring.  Performing a test to check blood flow to the penis.  Doing a sleep study at home to measure nighttime erections. TREATMENT   You may be prescribed medicines by mouth.  You may be given medicine injections into the penis.  You may be prescribed a vacuum pump with a ring.  Penile implant surgery may be performed. You may receive:  An inflatable implant.  A semirigid implant.  Blood vessel surgery may be performed. HOME CARE INSTRUCTIONS  If you are prescribed oral medicine, you should take the medicine as prescribed. Do not increase the dosage without first discussing it with your physician.  If you are using self-injections, be careful to avoid any veins that are on the surface of the penis. Apply pressure to the injection site for 5 minutes.  If you are using a vacuum pump, make sure you have read the instructions before using it. Discuss any questions with your physician before taking the pump home. SEEK MEDICAL CARE IF:  You experience pain that is not responsive to the pain medicine you have been prescribed.  You experience nausea or vomiting. SEEK IMMEDIATE MEDICAL CARE IF:   When taking oral or injectable medications, you experience an erection that lasts longer than 4 hours. If your physician is unavailable, go to the nearest emergency room for evaluation. An erection that lasts much longer than 4 hours can result in permanent damage to your penis.  You have pain that is severe.  You develop redness, severe pain, or severe swelling of your penis.  You have redness spreading up into your groin or lower abdomen.  You are unable to pass your urine. Document Released: 08/25/2000 Document Revised: 04/30/2013 Document Reviewed: 01/30/2013 ExitCare Patient Information 2015 ExitCare, LLC. This information is not   intended to replace advice given to you by your health care provider. Make sure you discuss any questions you have with your health care provider.  

## 2014-05-28 NOTE — Progress Notes (Signed)
Patient ID: Nathan Cherry, male   DOB: 11/26/1959, 53 y.o.   MRN: [id number]   Nathan Cherry, is a 53 y.o. male  [id number]  [id number]  DOB - 11/26/1959  Chief Complaint  Patient presents with  . Follow-up        Subjective:   Nathan Cherry is a 53 y.o. male here today for a follow up visit. Patient has hypertension and low testosterone level, here today for follow-up and treatment of low testosterone. Patient continues to have intense anxiety attacks, requesting medications/anxiolytics. Patient is having pain in his lower gums and is causing him to have headaches, requesting referral to dentist. Patient has No chest pain, No abdominal pain - No Nausea, No new weakness tingling or numbness, No Cough - SOB.  Problem  Gingivitis, Chronic    ALLERGIES: Allergies  Allergen Reactions  . Doxycycline Itching    PAST MEDICAL HISTORY: Past Medical History  Diagnosis Date  . Low testosterone   . Hypertension   . Herpes     MEDICATIONS AT HOME: Prior to Admission medications   Medication Sig Start Date End Date Taking? Authorizing Provider  acyclovir (ZOVIRAX) 400 MG tablet Take 1 tablet (400 mg total) by mouth daily. 05/28/14  Yes Quentin Angst, MD  ALPRAZolam Prudy Feeler) 1 MG tablet Take 1 tablet (1 mg total) by mouth at bedtime as needed for anxiety. 05/28/14  Yes Quentin Angst, MD  Benzoyl Peroxide (ACNE MAXIMUM STRENGTH) 10 % CREA Apply once daily 10/07/13  Yes Deepak Advani, MD  cyclobenzaprine (FLEXERIL) 10 MG tablet Take 1 tablet (10 mg total) by mouth 3 (three) times daily as needed for muscle spasms. 02/16/14  Yes Quentin Angst, MD  metoprolol succinate (TOPROL-XL) 50 MG 24 hr tablet Take 1 tablet (50 mg total) by mouth daily. Take with or immediately following a meal. 01/05/14  Yes Carney Living, MD  omeprazole (PRILOSEC) 20 MG capsule Take 1 capsule (20 mg total) by mouth daily. Take 30 minutes before breakfast 02/05/14  Yes  Ambrose Finland, NP  SUMAtriptan (IMITREX) 50 MG tablet Take 1 tablet (50 mg total) by mouth every 2 (two) hours as needed for migraine or headache. May repeat in 2 hours if headache persists or recurs. 01/05/14  Yes Carney Living, MD  tamsulosin (FLOMAX) 0.4 MG CAPS capsule Take 1 capsule (0.4 mg total) by mouth daily. 02/09/14  Yes Ambrose Finland, NP  triamcinolone cream (KENALOG) 0.1 % Apply 1 application topically 2 (two) times daily. spanish 10/07/13  Yes Deepak Advani, MD  cromolyn (OPTICROM) 4 % ophthalmic solution Place 2 drops into both eyes 5 (five) times daily. 11/17/13   Mathis Fare Presson, PA  diclofenac (VOLTAREN) 75 MG EC tablet Take 1 tablet (75 mg total) by mouth 2 (two) times daily. 11/17/13   Ria Clock, PA  testosterone cypionate (DEPO-TESTOSTERONE) 200 MG/ML injection Inject 1 mL (200 mg total) into the muscle every 28 (twenty-eight) days. 05/28/14   Quentin Angst, MD     Objective:   Filed Vitals:   05/28/14 1652  BP: 123/88  Pulse: 101  Temp: 99 F (37.2 C)  TempSrc: Oral  Resp: 14  Height:  (1.676 m)  Weight: 214 lb (97.07 kg)  SpO2: 95%    Exam General appearance : Awake, alert, not in any distress. Speech Clear. Not toxic looking HEENT: Atraumatic and Normocephalic, pupils equally reactive to light and accomodation Neck: supple, no JVD. No cervical lymphadenopathy.  Chest:Good  air entry bilaterally, no added sounds  CVS: S1 S2 regular, no murmurs.  Abdomen: Bowel sounds present, Non tender and not distended with no gaurding, rigidity or rebound. Extremities: B/L Lower Ext shows no edema, both legs are warm to touch Neurology: Awake alert, and oriented X 3, CN II-XII intact, Non focal Skin:No Rash Wounds:N/A  Data Review Lab Results  Component Value Date   HGBA1C 5.1 02/05/2014     Assessment & Plan   1. Low testosterone  - testosterone cypionate (DEPO-TESTOSTERONE) 200 MG/ML injection; Inject 1 mL (200 mg total) into the  muscle every 28 (twenty-eight) days.  Dispense: 10 mL; Refill: 0  2. Gingivitis, chronic  - Ambulatory referral to Dentistry  3. Generalized anxiety disorder  - ALPRAZolam (XANAX) 1 MG tablet; Take 1 tablet (1 mg total) by mouth at bedtime as needed for anxiety.  Dispense: 15 tablet; Refill: 0  Interpreter was used to communicate directly with patient for the entire encounter including providing detailed patient instructions.   Return in about 6 months (around 11/26/2014), or if symptoms worsen or fail to improve, for And Monthly for im testosterone injection.  The patient was given clear instructions to go to ER or return to medical center if symptoms don't improve, worsen or new problems develop. The patient verbalized understanding. The patient was told to call to get lab results if they haven't heard anything in the next week.   This note has been created with Education officer, environmental. Any transcriptional errors are unintentional.    Jeanann Lewandowsky, MD, MHA, FACP, FAAP [facility] [place], [place] [phone]   05/28/2014, 5:34 PM

## 2014-05-28 NOTE — Progress Notes (Signed)
Pt is here today because blood work reviewed and noticed low testosterone levels. Pt is still having pain in his lower gums and is now causing him pain in his head.

## 2014-06-05 ENCOUNTER — Ambulatory Visit: Payer: No Typology Code available for payment source | Attending: Internal Medicine | Admitting: Pharmacist

## 2014-06-05 DIAGNOSIS — E291 Testicular hypofunction: Secondary | ICD-10-CM | POA: Insufficient documentation

## 2014-06-05 DIAGNOSIS — R7989 Other specified abnormal findings of blood chemistry: Secondary | ICD-10-CM

## 2014-06-05 MED ORDER — TESTOSTERONE CYPIONATE 100 MG/ML IM SOLN
200.0000 mg | Freq: Once | INTRAMUSCULAR | Status: AC
  Administered 2014-06-05: 200 mg via INTRAMUSCULAR

## 2014-06-05 NOTE — Progress Notes (Signed)
Patient here for testosterone injection Patient supplied the vial from his pharmacy Vial is a 10 dose vial

## 2014-06-22 ENCOUNTER — Ambulatory Visit: Payer: No Typology Code available for payment source | Attending: Internal Medicine

## 2014-07-03 ENCOUNTER — Ambulatory Visit: Payer: No Typology Code available for payment source | Attending: Internal Medicine

## 2014-07-03 DIAGNOSIS — R7989 Other specified abnormal findings of blood chemistry: Secondary | ICD-10-CM

## 2014-07-03 MED ORDER — TESTOSTERONE CYPIONATE 100 MG/ML IM SOLN
200.0000 mg | INTRAMUSCULAR | Status: DC
  Administered 2014-07-03 – 2014-10-29 (×4): 200 mg via INTRAMUSCULAR

## 2014-07-03 NOTE — Patient Instructions (Signed)
Return at next visit 08/03/2014 for next Testosterone injectionTestosterone injection Qu es este medicamento? La TESTOSTERONA es la hormona masculina principal. Mantiene el desarrollo masculino normal, como el aumento de la masa muscular, el vello en el rostro y la voz grave. Se Medtronicutiliza en los hombres para tratar los niveles bajos de Pacetestosterona. Este medicamento puede ser utilizado para otros usos; si tiene alguna pregunta consulte con su proveedor de atencin mdica o con su farmacutico. MARCAS COMERCIALES DISPONIBLES: Andro-L.A., Aveed, Delatestryl, Depo-Testosterone, Virilon Qu le debo informar a mi profesional de la salud antes de tomar este medicamento? Necesita saber si usted presenta alguno de los siguientes problemas o situaciones: -cncer de mama -diabetes -enfermedad cardiaca -enfermedad renal -enfermedad heptica -enfermedad pulmonar -cncer de prstata, prstata dilatada -una reaccin alrgica o inusual a la testosterona, a otros medicamentos, alimentos, colorantes o conservantes -si est embarazada o buscando quedar embarazada -si est amamantando a un beb Cmo debo utilizar este medicamento? Este medicamento se administra mediante una inyeccin por va intramuscular. Generalmente lo administra un profesional de Radiographer, therapeuticla salud en un hospital o en un entorno clnico. Hable con su pediatra para informarse acerca del uso de este medicamento en nios. Aunque este medicamento ha sido recetado a nios tan menores como de 12 aos de edad para condiciones selectivas, las precauciones se aplican. Sobredosis: Pngase en contacto inmediatamente con un centro toxicolgico o una sala de urgencia si usted cree que haya tomado demasiado medicamento. ATENCIN: Reynolds AmericanEste medicamento es solo para usted. No comparta este medicamento con nadie. Qu sucede si me olvido de una dosis? Trate de no olvidar ninguna dosis. Su mdico o su profesional de Community education officerla salud le indicarn cuando es la prxima inyeccin.  Notifique a la clinica si no puede asistir a una cita. Qu puede interactuar con este medicamento? -medicamentos para la diabetes -medicamentos que tratan o previenen cogulos sanguneos, como warfarina -oxifenbutazona -propranolol -medicamentos esteroideos, como la prednisona o la cortisona Puede ser que esta lista no menciona todas las posibles interacciones. Informe a su profesional de Beazer Homesla salud de [employer] Inctodos los productos a base de hierbas, medicamentos de [place] libre o suplementos nutritivos que est tomando. Si usted fuma, consume bebidas alcohlicas o si utiliza drogas ilegales, indqueselo tambin a su profesional de Beazer Homesla salud. Algunas sustancias pueden interactuar con su medicamento. A qu debo estar atento al usar PPL Corporationeste medicamento? Visite a su mdico o a su profesional de la salud para chequear su evolucin peridicamente. Necesitarn verificar el nivel de testosterona en la [place]. Este medicamento slo est aprobado para su uso en hombres que tienen niveles bajos de testosterona relacionada con ciertas afecciones mdicas. Ha habido casos de ataques cardacos y derrames cerebrales con el uso de [place]este medicamento. Notifique a su mdico o profesional de la salud y busque tratamiento de emergencia si se desarrollan problemas respiratorios; cambios en la visin; confusin; dolor o presin en el pecho; dolor repentino del brazo; Engineer, miningdolor de Animatorcabeza severo, repentino; dificultad para hablar o comprender; repentino entumecimiento o debilidad de la cara, brazo o pierna; prdida del equilibrio o la coordinacin. Hable con su mdico Fortune Brandssobre los riesgos y beneficios de PPL Corporationeste medicamento. Este medicamento puede afectar su nivel de Bankerazcar en la sangre. Si es diabtico, consulte a su mdico o a su profesional de la salud antes de modificar su dieta o la dosis de su medicamento para la diabetes. La Harley-Davidsonmayora de las organizaciones de atletismo le prohben a los atletas el uso de [place]este medicamento. Qu efectos secundarios  puedo tener al Boston Scientificutilizar este medicamento?  Efectos secundarios que debe informar a su mdico o a Producer, television/film/videosu profesional de la salud tan pronto como sea posible: -Therapist, artreacciones alrgicas como erupcin cutnea, picazn o urticarias, hinchazn de la cara, labios o lengua -agrandamiento de los pechos -problemas respiratorios -cambios de humor, especialmente, ira, depresin o clera -orina oscura -sensacin general de estar enfermo o sntomas gripales -heces de color claro -prdida del apetito, nuseas -nuseas, vmito -dolor en la regin abdominal superior derecha -dolor estomacal -hinchazn de los tobillos -erecciones persistentes o frecuentes -dificultad para orinar o cambios en el volumen de orina -cansancio o debilidad inusual -color amarillento de los ojos o la piel Otros efectos secundarios que pueden aparecer en las mujeres: -voz grave o ronquera -crecimiento de vello en el rostro -perodos menstruales irregulares Efectos secundarios que, por lo general, no requieren Psychologist, prison and probation servicesatencin mdica (debe informarlos a su mdico o a Producer, television/film/videosu profesional de la salud si persisten o si son molestos): -acn -cambios en el deseo sexual o capacidad -cada del cabello -dolor de cabeza Puede ser que esta lista no menciona todos los posibles efectos secundarios. Comunquese a su mdico por asesoramiento mdico Hewlett-Packardsobre los efectos secundarios. Usted puede informar los efectos secundarios a la FDA por telfono al 1-800-FDA-1088. Dnde debo guardar mi medicina? Mantngala fuera del alcance de los nios. Este medicamento puede ser abusado. Mantenga su medicamento en un lugar seguro para protegerlo contra robos. No comparta este medicamento con nadie. Es peligroso vender o Restaurant manager, fast foodregalar este medicamento y est prohibido por la ley. Gurdela a temperatura ambiente de entre 20 y 25 grados C (368 y 2377 grados F). No la congele. Protjala de la luz. Siga las instrucciones para le producto que se le haya recetado. Deseche todo el medicamento que  no haya utilizado, despus de la fecha de vencimiento. ATENCIN: Este folleto es un resumen. Puede ser que no cubra toda la posible informacin. Si usted tiene preguntas acerca de esta medicina, consulte con su mdico, su farmacutico o su profesional de Radiographer, therapeuticla salud.  2015, Elsevier/Gold Standard. (2013-12-03 13:22:48)

## 2014-08-03 ENCOUNTER — Other Ambulatory Visit: Payer: Self-pay | Admitting: Internal Medicine

## 2014-08-03 ENCOUNTER — Ambulatory Visit: Payer: Self-pay | Attending: Internal Medicine | Admitting: Pharmacist

## 2014-08-03 DIAGNOSIS — E291 Testicular hypofunction: Secondary | ICD-10-CM | POA: Insufficient documentation

## 2014-08-03 DIAGNOSIS — R7989 Other specified abnormal findings of blood chemistry: Secondary | ICD-10-CM

## 2014-08-03 MED ORDER — TESTOSTERONE CYPIONATE 200 MG/ML IM SOLN
200.0000 mg | Freq: Once | INTRAMUSCULAR | Status: AC
  Administered 2014-08-03: 200 mg via INTRAMUSCULAR

## 2014-08-03 NOTE — Progress Notes (Signed)
Patient ID: Nathan CantorRigoberto I Cherry, male   DOB: 11/26/1959, 53 y.o.   MRN: [id number] Pt is here for monthly Testosterone injection s/p low level Medications with pt

## 2014-08-03 NOTE — Patient Instructions (Signed)
Testosterone IM injection given Return in 2 weeks for testosterone level Next injection due 09/02/2014

## 2014-08-17 ENCOUNTER — Ambulatory Visit: Payer: Self-pay | Admitting: Internal Medicine

## 2014-08-19 ENCOUNTER — Ambulatory Visit: Payer: Self-pay | Attending: Internal Medicine

## 2014-08-19 DIAGNOSIS — R7989 Other specified abnormal findings of blood chemistry: Secondary | ICD-10-CM

## 2014-08-20 LAB — TESTOSTERONE: Testosterone: 192 ng/dL — ABNORMAL LOW (ref 300–890)

## 2014-08-24 ENCOUNTER — Encounter: Payer: Self-pay | Admitting: Internal Medicine

## 2014-08-24 ENCOUNTER — Ambulatory Visit: Payer: Self-pay | Attending: Internal Medicine | Admitting: Internal Medicine

## 2014-08-24 VITALS — BP 118/80 | HR 105 | Temp 98.6°F | Resp 18 | Ht 66.0 in | Wt 211.0 lb

## 2014-08-24 DIAGNOSIS — I10 Essential (primary) hypertension: Secondary | ICD-10-CM | POA: Insufficient documentation

## 2014-08-24 DIAGNOSIS — M25512 Pain in left shoulder: Secondary | ICD-10-CM | POA: Insufficient documentation

## 2014-08-24 DIAGNOSIS — M25519 Pain in unspecified shoulder: Secondary | ICD-10-CM | POA: Insufficient documentation

## 2014-08-24 DIAGNOSIS — E291 Testicular hypofunction: Secondary | ICD-10-CM | POA: Insufficient documentation

## 2014-08-24 DIAGNOSIS — T7840XA Allergy, unspecified, initial encounter: Secondary | ICD-10-CM | POA: Insufficient documentation

## 2014-08-24 DIAGNOSIS — Z7952 Long term (current) use of systemic steroids: Secondary | ICD-10-CM | POA: Insufficient documentation

## 2014-08-24 MED ORDER — TRAMADOL HCL 50 MG PO TABS: 50.0000 mg | ORAL_TABLET | Freq: Three times a day (TID) | ORAL | Status: DC | PRN

## 2014-08-24 MED ORDER — DIPHENHYDRAMINE HCL 25 MG PO TABS: 25.0000 mg | ORAL_TABLET | Freq: Four times a day (QID) | ORAL | Status: DC | PRN

## 2014-08-24 MED ORDER — PREDNISONE 20 MG PO TABS: 20.0000 mg | ORAL_TABLET | Freq: Every day | ORAL | Status: DC

## 2014-08-24 NOTE — Progress Notes (Signed)
Pt comes in to f/u with possible allergic reaction to new medication he started x 2 weeks ago Started taking otc Garlic supplements Kyolic tablets when sx's started Rash noted with itching to neck with redness to both hands Denies chest pain today with bialt arm shooting, throbbing pain x 3 weeks Requesting flu vaccine  Spanish interpretor present

## 2014-08-24 NOTE — Patient Instructions (Signed)
Plan de alimentacin DASH (DASH Eating Plan) DASH es la sigla en ingls de "Enfoques Alimentarios para Detener la Hipertensin". El plan de alimentacin DASH ha demostrado bajar la presin arterial elevada (hipertensin). Los beneficios adicionales para la salud pueden incluir la disminucin del riesgo de diabetes mellitus tipo2, enfermedades cardacas e ictus. Este plan tambin puede ayudar a adelgazar. QU DEBO SABER ACERCA DEL PLAN DE ALIMENTACIN DASH? Para el plan de alimentacin DASH, seguir las siguientes pautas generales:  Elija los alimentos con un valor porcentual diario de sodio de menos del 5% (segn figura en la etiqueta del alimento).  Use hierbas o aderezos sin sal, en lugar de sal de mesa o sal marina.  Consulte al mdico o farmacutico antes de usar sustitutos de la sal.  Coma productos con bajo contenido de sodio, cuya etiqueta suele decir "bajo contenido de sodio" o "sin agregado de sal".  Coma alimentos frescos.  Coma ms verduras, frutas y productos lcteos con bajo contenido de grasas.  Elija los cereales integrales. Busque la palabra "integral" en el primer lugar de la lista de ingredientes.  Elija el pescado y el pollo o el pavo sin piel ms a menudo que las carnes rojas. Limite el consumo de pescado, carne de ave y carne a 6onzas (170g) por da.  Limite el consumo de dulces, postres, azcares y bebidas azucaradas.  Elija las grasas saludables para el corazn.  Limite el consumo de queso a 1onza (28g) por da.  Consuma ms comida casera y menos de restaurante, de buf y comida rpida.  Limite el consumo de alimentos fritos.  Cocine los alimentos utilizando mtodos que no sean la fritura.  Limite las verduras enlatadas. Si las consume, enjuguelas bien para disminuir el sodio.  Cuando coma en un restaurante, pida que preparen su comida con menos sal o, en lo posible, sin nada de sal. QU ALIMENTOS PUEDO COMER? Pida ayuda a un nutricionista para  conocer las necesidades calricas individuales. Cereales Pan de salvado o integral. Arroz integral. Pastas de salvado o integrales. Quinua, trigo burgol y cereales integrales. Cereales con bajo contenido de sodio. Tortillas de harina de maz o de salvado. Pan de maz integral. Galletas saladas integrales. Galletas con bajo contenido de sodio. Vegetales Verduras frescas o congeladas (crudas, al vapor, asadas o grilladas). Jugos de tomate y verduras con contenido bajo o reducido de sodio. Pasta y salsa de tomate con contenido bajo o reducido de sodio. Verduras enlatadas con bajo contenido de sodio o reducido de sodio.  Frutas Frutas frescas, en conserva (en su jugo natural) o frutas congeladas. Carnes y otros productos con protenas Carne de res molida (al 85% o ms magra), carne de res de animales alimentados con pastos o carne de res sin la grasa. Pollo o pavo sin piel. Carne de pollo o de pavo molida. Cerdo sin la grasa. Todos los pescados y frutos de mar. Huevos. Porotos, guisantes o lentejas secos. Frutos secos y semillas sin sal. Frijoles enlatados sin sal. Lcteos Productos lcteos con bajo contenido de grasas, como leche descremada o al 1%, quesos reducidos en grasas o al 2%, ricota con bajo contenido de grasas o queso cottage, o yogur natural con bajo contenido de grasas. Quesos con contenido bajo o reducido de sodio. Grasas y aceites Margarinas en barra que no contengan grasas trans. Mayonesa y alios para ensaladas livianos o reducidos en grasas (reducidos en sodio). [place]. Aceites de crtamo, oliva o canola. Mantequilla natural de man o almendra. Otros Palomitas de maz y pretzels sin sal.   Los artculos mencionados arriba pueden no ser una lista completa de las bebidas o los alimentos recomendados. Comunquese con el nutricionista para conocer ms opciones. QU ALIMENTOS NO SE RECOMIENDAN? Cereales Pan blanco. Pastas blancas. Arroz blanco. Pan de maz refinado. Bagels y  croissants. Galletas saladas que contengan grasas trans. Vegetales Vegetales con crema o fritos. Verduras en salsa de queso. Verduras enlatadas comunes. Pasta y salsa de tomate en lata comunes. Jugos comunes de tomate y de verduras. Frutas Frutas secas. Fruta enlatada en almbar liviano o espeso. Jugo de frutas. Carnes y otros productos con protenas Cortes de carne con grasa. Costillas, alas de pollo, tocineta, salchicha, mortadela, salame, chinchulines, tocino, perros calientes, salchichas alemanas y embutidos envasados. Frutos secos y semillas con sal. Frijoles con sal en lata. Lcteos Leche entera o al 2%, crema, mezcla de leche y crema, y queso crema. Yogur entero o endulzado. Quesos o queso azul con alto contenido de grasas. [place]remas no lcteas y coberturas batidas. Quesos procesados, quesos para untar o cuajadas. Condimentos Sal de cebolla y ajo, sal condimentada, sal de mesa y sal marina. Salsas en lata y envasadas. Salsa [place]. Salsa trtara. Salsa barbacoa. Salsa teriyaki. Salsa de soja, incluso la que tiene contenido reducido de sodio. Salsa de carne. Salsa de pescado. Salsa de ostras. Salsa rosada. Rbano picante. Ketchup y mostaza. Saborizantes y tiernizantes para carne. Caldo en cubitos. Salsa picante. Salsa tabasco. Adobos. Aderezos para tacos. Salsas. Grasas y aceites Mantequilla, margarina en barra, manteca de cerdo, grasa, mantequilla clarificada y grasa de tocino. Aceites de coco, de palmiste o de palma. Aderezos comunes para ensalada. Otros Pickles y aceitunas. Palomitas de maz y pretzels con sal. Los artculos mencionados arriba pueden no ser una lista completa de las bebidas y los alimentos que se deben evitar. Comunquese con el nutricionista para obtener ms informacin. DNDE PUEDO ENCONTRAR MS INFORMACIN? Instituto Nacional del Corazn, del Pulmn y de la Sangre (National Heart, Lung, and Blood Institute):  www.nhlbi.nih.gov/health/health-topics/topics/dash/ Document Released: 08/17/2011 Document Revised: 01/12/2014 ExitCare Patient Information 2015 ExitCare, LLC. This information is not intended to replace advice given to you by your health care provider. Make sure you discuss any questions you have with your health care provider. Hipertensin (Hypertension) La hipertensin, conocida comnmente como presin arterial alta, se produce cuando la sangre bombea en las arterias con mucha fuerza. Las arterias son los vasos sanguneos que transportan la sangre desde el corazn hacia todas las partes del cuerpo. Una lectura de la presin arterial consiste en un nmero ms alto sobre un nmero ms bajo, por ejemplo, 110/72. El nmero ms alto (presin sistlica) corresponde a la presin interna de las arterias cuando el corazn bombea sangre. El nmero ms bajo (presin diastlica) corresponde a la presin interna de las arterias cuando el corazn se relaja. En condiciones ideales, la presin arterial debe ser inferior a 120/80. La hipertensin fuerza al corazn a trabajar ms para bombear la sangre. Las arterias pueden estrecharse o ponerse rgidas. La hipertensin conlleva el riesgo de enfermedad cardaca, ictus y otros problemas.  FACTORES DE RIESGO Algunos factores de riesgo de hipertensin son controlables, pero otros no lo son.  Entre los factores de riesgo que usted no puede controlar, se incluyen:   La raza. El riesgo es mayor para las personas afroamericanas.  La edad. Los riesgos aumentan con la edad.  El sexo. Antes de los 45aos, los hombres corren ms riesgo que las mujeres. Despus de los 65aos, las mujeres corren ms riesgo que los hombres. Entre los factores de riesgo   que usted puede controlar, se incluyen:  No hacer la cantidad suficiente de actividad fsica o ejercicio.  Tener sobrepeso.  Consumir mucha grasa, azcar, caloras o sal en la dieta.  Beber alcohol en exceso. SIGNOS Y  SNTOMAS Por lo general, la hipertensin no causa signos o sntomas. La hipertensin demasiado alta (crisis hipertensiva) puede causar dolor de cabeza, ansiedad, falta de aire y hemorragia nasal. DIAGNSTICO  Para detectar si usted tiene hipertensin, el mdico le medir la presin arterial mientras est sentado, con el brazo levantado a la altura del corazn. Debe medirla al menos dos veces en el mismo brazo. Determinadas condiciones pueden causar una diferencia de presin arterial entre el brazo izquierdo y el derecho. El hecho de tener una sola lectura de la presin arterial ms alta que lo normal no significa que necesita un tratamiento. En el caso de tener una lectura de la presin arterial con un valor alto, pdale al mdico que la verifique nuevamente. TRATAMIENTO  El tratamiento de la hipertensin arterial incluye hacer cambios en el estilo de vida y, posiblemente, tomar medicamentos. Un estilo de vida saludable puede ayudar a bajar la presin arterial alta. Quiz deba cambiar algunos hbitos. Los cambios en el estilo de vida pueden incluir:  Seguir la dieta DASH. Esta dieta tiene un alto contenido de frutas, verduras y cereales integrales. Incluye poca cantidad de sal, carnes rojas y azcares agregados.  Hacer al menos 2horas de actividad fsica enrgica todas las semanas.  Perder peso, si es necesario.  No fumar.  Limitar el consumo de bebidas alcohlicas.  Aprender formas de reducir el estrs. Si los cambios en el estilo de vida no son suficientes para lograr controlar la presin arterial, el mdico puede recetarle medicamentos. Quiz necesite tomar ms de uno. Trabaje en conjunto con su mdico para comprender los riesgos y los beneficios. INSTRUCCIONES PARA EL CUIDADO EN EL HOGAR  Haga que le midan de nuevo la presin arterial segn las indicaciones del mdico.  Tome los medicamentos solamente como se lo haya indicado el mdico. Siga cuidadosamente las indicaciones. Los  medicamentos para la presin arterial deben tomarse segn las indicaciones. Los medicamentos pierden eficacia al omitir las dosis. El hecho de omitir las dosis tambin aumenta el riesgo de otros problemas.  No fume.  Contrlese la presin arterial en su casa segn las indicaciones del mdico. SOLICITE ATENCIN MDICA SI:   Piensa que tiene una reaccin alrgica a los medicamentos.  Tiene mareos o dolores de cabeza con recurrencia.  Tiene hinchazn en los tobillos.  Tiene problemas de visin. SOLICITE ATENCIN MDICA DE INMEDIATO SI:  Siente un dolor de cabeza intenso o confusin.  Siente debilidad inusual, adormecimiento o que se desmayar.  Siente dolor intenso en el pecho o en el abdomen.  Vomita repetidas veces.  Tiene dificultad para respirar. ASEGRESE DE QUE:   Comprende estas instrucciones.  Controlar su afeccin.  Recibir ayuda de inmediato si no mejora o si empeora. Document Released: 08/28/2005 Document Revised: 01/12/2014 ExitCare Patient Information 2015 ExitCare[employer]. This information is not intended to replace advice given to you by your health care provider. Make sure you discuss any questions you have with your health care provider.  

## 2014-08-24 NOTE — Progress Notes (Signed)
Patient ID: Nathan Cherry, male   DOB: 11/26/1959, 53 y.o.   MRN: [id number]   Nathan Cherry, is a 53 y.o. male  [id number]  NFA:[id number]  DOB - 11/26/1959  Chief Complaint  Patient presents with  . Follow-up  . Allergic Reaction  . Arm Pain        Subjective:   Nathan Cherry is a 53 y.o. male here today for a follow up visit. Patient has history of hypertension, generalized anxiety disorder, chronic abdominal pain , low testosterone here today for possible allergic reaction to new medication is to take 2 weeks ago he started taking over-the-counter garlic supplements and has broken out in rashes and itching. Denies chest pain, no sore throat, no difficulty breathing, no difficulty swallowing, no cough. Patient has No headache, No chest pain, No abdominal pain - No Nausea, No new weakness tingling or numbness,   Problem  Allergy  Pain in Joint, Shoulder Region    ALLERGIES: Allergies  Allergen Reactions  . Doxycycline Itching    PAST MEDICAL HISTORY: Past Medical History  Diagnosis Date  . Low testosterone   . Hypertension   . Herpes     MEDICATIONS AT HOME: Prior to Admission medications   Medication Sig Start Date End Date Taking? Authorizing Provider  acyclovir (ZOVIRAX) 400 MG tablet Take 1 tablet (400 mg total) by mouth daily. 05/28/14  Yes Quentin Angstlugbemiga E Donnella Morford, MD  ALPRAZolam Prudy Feeler(XANAX) 1 MG tablet Take 1 tablet (1 mg total) by mouth at bedtime as needed for anxiety. 05/28/14  Yes Quentin Angstlugbemiga E Alizabeth Antonio, MD  cyclobenzaprine (FLEXERIL) 10 MG tablet Take 1 tablet (10 mg total) by mouth 3 (three) times daily as needed for muscle spasms. 02/16/14  Yes Quentin Angstlugbemiga E Delina Kruczek, MD  diclofenac (VOLTAREN) 75 MG EC tablet Take 1 tablet (75 mg total) by mouth 2 (two) times daily. 11/17/13  Yes Mathis FareJennifer Lee H Presson, PA  metoprolol succinate (TOPROL-XL) 50 MG 24 hr tablet Take 1 tablet (50 mg total) by mouth daily. Take with or immediately following a meal. 01/05/14   Yes Carney LivingMarshall L Chambliss, MD  Benzoyl Peroxide (ACNE MAXIMUM STRENGTH) 10 % CREA Apply once daily Patient not taking: Reported on 08/24/2014 10/07/13   Doris Cheadleeepak Advani, MD  cromolyn (OPTICROM) 4 % ophthalmic solution Place 2 drops into both eyes 5 (five) times daily. Patient not taking: Reported on 08/24/2014 11/17/13   Ria ClockJennifer Lee H Presson, PA  diphenhydrAMINE (BENADRYL) 25 MG tablet Take 1 tablet (25 mg total) by mouth every 6 (six) hours as needed. 08/24/14   Quentin Angstlugbemiga E Drea Jurewicz, MD  omeprazole (PRILOSEC) 20 MG capsule Take 1 capsule (20 mg total) by mouth daily. Take 30 minutes before breakfast Patient not taking: Reported on 08/24/2014 02/05/14   Ambrose FinlandValerie A Keck, NP  predniSONE (DELTASONE) 20 MG tablet Take 1 tablet (20 mg total) by mouth daily with breakfast. 08/24/14   Quentin Angstlugbemiga E Lajune Perine, MD  SUMAtriptan (IMITREX) 50 MG tablet Take 1 tablet (50 mg total) by mouth every 2 (two) hours as needed for migraine or headache. May repeat in 2 hours if headache persists or recurs. Patient not taking: Reported on 08/24/2014 01/05/14   Carney LivingMarshall L Chambliss, MD  tamsulosin (FLOMAX) 0.4 MG CAPS capsule Take 1 capsule (0.4 mg total) by mouth daily. Patient not taking: Reported on 08/24/2014 02/09/14   Ambrose FinlandValerie A Keck, NP  testosterone cypionate (DEPO-TESTOSTERONE) 200 MG/ML injection Inject 1 mL (200 mg total) into the muscle every 28 (twenty-eight) days. 05/28/14   Suhaan Perleberg Annitta NeedsE Lamont Glasscock,  MD  traMADol (ULTRAM) 50 MG tablet Take 1 tablet (50 mg total) by mouth every 8 (eight) hours as needed. 08/24/14   Quentin Angstlugbemiga E Shanika Levings, MD  triamcinolone cream (KENALOG) 0.1 % Apply 1 application topically 2 (two) times daily. spanish Patient not taking: Reported on 08/24/2014 10/07/13   Doris Cheadleeepak Advani, MD     Objective:   Filed Vitals:   08/24/14 1601  BP: 118/80  Pulse: 105  Temp: 98.6 F (37 C)  TempSrc: Oral  Resp: 18  Height: 5\' 6"  (1.676 m)  Weight: 211 lb (95.709 kg)  SpO2: 96%    Exam General appearance :  Awake, alert, not in any distress. Speech Clear. Not toxic looking HEENT: Atraumatic and Normocephalic, pupils equally reactive to light and accomodation Neck: supple, no JVD. No cervical lymphadenopathy.  Chest:Good air entry bilaterally, no added sounds  CVS: S1 S2 regular, no murmurs.  Abdomen: Bowel sounds present, Non tender and not distended with no gaurding, rigidity or rebound. Extremities: B/L Lower Ext shows no edema, both legs are warm to touch Neurology: Awake alert, and oriented X 3, CN II-XII intact, Non focal Skin:No Rash Wounds:N/A  Data Review Lab Results  Component Value Date   HGBA1C 5.1 02/05/2014     Assessment & Plan   1. Allergy, initial encounter  - predniSONE (DELTASONE) 20 MG tablet; Take 1 tablet (20 mg total) by mouth daily with breakfast.  Dispense: 5 tablet; Refill: 0 - diphenhydrAMINE (BENADRYL) 25 MG tablet; Take 1 tablet (25 mg total) by mouth every 6 (six) hours as needed.  Dispense: 30 tablet; Refill: 0  2. Pain in joint, shoulder region, left  - traMADol (ULTRAM) 50 MG tablet; Take 1 tablet (50 mg total) by mouth every 8 (eight) hours as needed.  Dispense: 30 tablet; Refill: 0  Interpreter was used to communicate directly with patient for the entire encounter including providing detailed patient instructions.   Return in about 6 months (around 02/23/2015), or Monthly Testosterone Injection, next due Sep 02, 2014, for Follow up HTN, Routine Follow Up.  The patient was given clear instructions to go to ER or return to medical center if symptoms don't improve, worsen or new problems develop. The patient verbalized understanding. The patient was told to call to get lab results if they haven't heard anything in the next week.   This note has been created with Education officer, environmentalDragon speech recognition software and smart phrase technology. Any transcriptional errors are unintentional.    Jeanann LewandowskyJEGEDE, Taryn Nave, MD, MHA, FACP, FAAP [facility] [place], [place] [phone]   08/24/2014, 4:26 PM

## 2014-09-01 ENCOUNTER — Ambulatory Visit: Payer: Self-pay | Attending: Internal Medicine | Admitting: *Deleted

## 2014-09-01 VITALS — BP 133/76 | HR 102

## 2014-09-01 DIAGNOSIS — E291 Testicular hypofunction: Secondary | ICD-10-CM

## 2014-09-01 DIAGNOSIS — R7989 Other specified abnormal findings of blood chemistry: Secondary | ICD-10-CM

## 2014-09-01 DIAGNOSIS — Z7689 Persons encountering health services in other specified circumstances: Secondary | ICD-10-CM | POA: Insufficient documentation

## 2014-09-01 NOTE — Progress Notes (Signed)
Patient presents for testosterone injection  Patient given information on low cost dental clinics

## 2014-09-15 ENCOUNTER — Other Ambulatory Visit: Payer: Self-pay | Admitting: *Deleted

## 2014-09-15 MED ORDER — CROMOLYN SODIUM 4 % OP SOLN: 2.0000 [drp] | Freq: Every day | OPHTHALMIC | Status: DC

## 2014-09-16 ENCOUNTER — Encounter: Payer: Self-pay | Admitting: *Deleted

## 2014-09-16 NOTE — Progress Notes (Signed)
   Subjective:    Patient ID: Nathan Cherry, male    DOB: 11/26/1959, 54 y.o.   MRN: [id number]  HPI    Review of Systems     Objective:   Physical Exam        Assessment & Plan:

## 2014-09-16 NOTE — Progress Notes (Signed)
Pt came in today requesting a refill for his eye drops. After speaking with his doctor he said that we could refill his eye drops. I sent the rx to his pharmacy.

## 2014-09-18 ENCOUNTER — Other Ambulatory Visit: Payer: Self-pay | Admitting: *Deleted

## 2014-09-18 MED ORDER — CROMOLYN SODIUM 4 % OP SOLN: 2.0000 [drp] | Freq: Every day | OPHTHALMIC | Status: DC

## 2014-09-24 ENCOUNTER — Telehealth: Payer: Self-pay | Admitting: Internal Medicine

## 2014-09-24 NOTE — Telephone Encounter (Signed)
Pt stated has abdominal burning and feeling uncountable advised to come in to walking clinic Between 9-11 and 2-4 for evaluation  (information was given in spanish)

## 2014-09-24 NOTE — Telephone Encounter (Signed)
Patient called stating that he has been having a stomach ache and would like to speak to nurse for advice. Please f/u with pt.

## 2014-09-25 ENCOUNTER — Ambulatory Visit: Payer: Self-pay | Attending: Internal Medicine | Admitting: Internal Medicine

## 2014-09-25 DIAGNOSIS — R7989 Other specified abnormal findings of blood chemistry: Secondary | ICD-10-CM

## 2014-09-25 DIAGNOSIS — E291 Testicular hypofunction: Secondary | ICD-10-CM

## 2014-09-25 NOTE — Progress Notes (Signed)
Spoke with patient via [employer], [place], [place] [phone] Patient states he has been having stomach "irritation" for "years" and has worsened over last month Patient denies pain or burning sensation. Advised patient to make appt with PCP to discuss

## 2014-09-29 ENCOUNTER — Ambulatory Visit: Payer: Self-pay | Attending: Internal Medicine | Admitting: *Deleted

## 2014-09-29 VITALS — BP 130/78 | HR 98

## 2014-09-29 DIAGNOSIS — E291 Testicular hypofunction: Secondary | ICD-10-CM | POA: Insufficient documentation

## 2014-09-29 DIAGNOSIS — R7989 Other specified abnormal findings of blood chemistry: Secondary | ICD-10-CM

## 2014-09-29 NOTE — Progress Notes (Signed)
Spoke with patient via [place] Interpreter, Nathan Cherry Patient presents for testosterone injection Patient aware that next injection is due 10/27/14

## 2014-10-05 ENCOUNTER — Ambulatory Visit: Payer: Self-pay | Attending: Internal Medicine | Admitting: Internal Medicine

## 2014-10-05 ENCOUNTER — Other Ambulatory Visit: Payer: Self-pay

## 2014-10-05 ENCOUNTER — Ambulatory Visit (HOSPITAL_COMMUNITY)
Admission: RE | Admit: 2014-10-05 | Discharge: 2014-10-05 | Disposition: A | Discharge status: Home / Self Care | Payer: Self-pay | Source: Ambulatory Visit | Attending: Cardiology | Admitting: Cardiology

## 2014-10-05 ENCOUNTER — Encounter: Payer: Self-pay | Admitting: Internal Medicine

## 2014-10-05 VITALS — BP 143/98 | HR 117 | Temp 98.8°F | Resp 18 | Ht 66.0 in | Wt 215.0 lb

## 2014-10-05 DIAGNOSIS — R000 Tachycardia, unspecified: Secondary | ICD-10-CM | POA: Insufficient documentation

## 2014-10-05 DIAGNOSIS — R1030 Lower abdominal pain, unspecified: Secondary | ICD-10-CM | POA: Insufficient documentation

## 2014-10-05 DIAGNOSIS — Z881 Allergy status to other antibiotic agents status: Secondary | ICD-10-CM | POA: Insufficient documentation

## 2014-10-05 DIAGNOSIS — I10 Essential (primary) hypertension: Secondary | ICD-10-CM | POA: Insufficient documentation

## 2014-10-05 DIAGNOSIS — Z79899 Other long term (current) drug therapy: Secondary | ICD-10-CM | POA: Insufficient documentation

## 2014-10-05 DIAGNOSIS — F411 Generalized anxiety disorder: Secondary | ICD-10-CM | POA: Insufficient documentation

## 2014-10-05 LAB — CBC WITH DIFFERENTIAL/PLATELET
BASOS ABS: 0.1 10*3/uL (ref 0.0–0.1)
Basophils Relative: 1 % (ref 0–1)
EOS ABS: 0.3 10*3/uL (ref 0.0–0.7)
EOS PCT: 4 % (ref 0–5)
HCT: 47.9 % (ref 39.0–52.0)
Hemoglobin: 17.1 g/dL — ABNORMAL HIGH (ref 13.0–17.0)
LYMPHS ABS: 2.8 10*3/uL (ref 0.7–4.0)
LYMPHS PCT: 38 % (ref 12–46)
MCH: 30.1 pg (ref 26.0–34.0)
MCHC: 35.7 g/dL (ref 30.0–36.0)
MCV: 84.2 fL (ref 78.0–100.0)
MONO ABS: 0.7 10*3/uL (ref 0.1–1.0)
MONOS PCT: 9 % (ref 3–12)
MPV: 9.6 fL (ref 8.6–12.4)
Neutro Abs: 3.5 10*3/uL (ref 1.7–7.7)
Neutrophils Relative %: 48 % (ref 43–77)
Platelets: 396 10*3/uL (ref 150–400)
RBC: 5.69 MIL/uL (ref 4.22–5.81)
RDW: 13.8 % (ref 11.5–15.5)
WBC: 7.3 10*3/uL (ref 4.0–10.5)

## 2014-10-05 LAB — COMPLETE METABOLIC PANEL WITH GFR
ALK PHOS: 66 U/L (ref 39–117)
ALT: 31 U/L (ref 0–53)
AST: 20 U/L (ref 0–37)
Albumin: 4.1 g/dL (ref 3.5–5.2)
BILIRUBIN TOTAL: 0.5 mg/dL (ref 0.2–1.2)
BUN: 7 mg/dL (ref 6–23)
CALCIUM: 9.5 mg/dL (ref 8.4–10.5)
CO2: 28 mEq/L (ref 19–32)
Chloride: 101 mEq/L (ref 96–112)
Creat: 0.95 mg/dL (ref 0.50–1.35)
GFR, Est African American: >89 mL/min
GFR, Est Non African American: >89 mL/min
Glucose, Bld: 143 mg/dL — ABNORMAL HIGH (ref 70–99)
POTASSIUM: 4.1 meq/L (ref 3.5–5.3)
SODIUM: 137 meq/L (ref 135–145)
Total Protein: 6.8 g/dL (ref 6.0–8.3)

## 2014-10-05 LAB — POCT URINALYSIS DIPSTICK
Bilirubin, UA: NEGATIVE
GLUCOSE UA: NEGATIVE
KETONES UA: NEGATIVE
Leukocytes, UA: NEGATIVE
Nitrite, UA: NEGATIVE
PROTEIN UA: NEGATIVE
RBC UA: NEGATIVE
Spec Grav, UA: 1.015
Urobilinogen, UA: 0.2
pH, UA: 7

## 2014-10-05 LAB — LIPID PANEL
Cholesterol: 165 mg/dL (ref 0–200)
HDL: 30 mg/dL — AB (ref >39)
LDL Cholesterol: 66 mg/dL (ref 0–99)
TRIGLYCERIDES: 344 mg/dL — AB (ref <150)
Total CHOL/HDL Ratio: 5.5 Ratio
VLDL: 69 mg/dL — ABNORMAL HIGH (ref 0–40)

## 2014-10-05 LAB — AMYLASE: Amylase: 39 U/L (ref 0–105)

## 2014-10-05 LAB — LIPASE: Lipase: 43 U/L (ref 0–75)

## 2014-10-05 NOTE — Patient Instructions (Signed)
Dolor abdominal °(Abdominal Pain) °El dolor puede tener muchas causas. Normalmente la causa del dolor abdominal no es una enfermedad y mejorará sin tratamiento. Frecuentemente puede controlarse y tratarse en casa. Su médico le realizará un examen físico y posiblemente solicite análisis de sangre y radiografías para ayudar a determinar la gravedad de su dolor. Sin embargo, en muchos casos, debe transcurrir más tiempo antes de que se pueda encontrar una causa evidente del dolor. Antes de llegar a ese punto, es posible que su médico no sepa si necesita más pruebas o un tratamiento más profundo. °INSTRUCCIONES PARA EL CUIDADO EN EL HOGAR  °Esté atento al dolor para ver si hay cambios. Las siguientes indicaciones ayudarán a aliviar cualquier molestia que pueda sentir: °· Tome solo medicamentos de venta libre o recetados, según las indicaciones del médico. °· No tome laxantes a menos que se lo haya indicado su médico. °· Pruebe con una dieta líquida absoluta (caldo, té o agua) según se lo indique su médico. Introduzca gradualmente una dieta normal, según su tolerancia. °SOLICITE ATENCIÓN MÉDICA SI: °· Tiene dolor abdominal sin explicación. °· Tiene dolor abdominal relacionado con náuseas o diarrea. °· Tiene dolor cuando orina o defeca. °· Experimenta dolor abdominal que lo despierta de noche. °· Tiene dolor abdominal que empeora o mejora cuando come alimentos. °· Tiene dolor abdominal que empeora cuando come alimentos grasosos. °· Tiene fiebre. °SOLICITE ATENCIÓN MÉDICA DE INMEDIATO SI:  °· El dolor no desaparece en un plazo máximo de 2 horas. °· No deja de (vomitar). °· El dolor se siente solo en partes del abdomen, como el lado derecho o la parte inferior izquierda del abdomen. °· Evacúa materia fecal sanguinolenta o negra, de aspecto alquitranado. °ASEGÚRESE DE QUE: °· Comprende estas instrucciones. °· Controlará su afección. °· Recibirá ayuda de inmediato si no mejora o si empeora. °Document Released: 08/28/2005  Document Revised: 09/02/2013 °ExitCare® Patient Information ©2015 [employer]. This information is not intended to replace advice given to you by your health care provider. Make sure you discuss any questions you have with your health care provider. ° °

## 2014-10-05 NOTE — Progress Notes (Signed)
Patient ID: Nathan Cherry, male   DOB: 1960-05-05, 54 y.o.   MRN: [id number]   Nathan Cherry, is a 54 y.o. male  [id number]  NFA:[id number]  DOB - 1960-05-05  Chief Complaint  Patient presents with  . Follow-up  . Abdominal Pain        Subjective:   Nathan ConnorsRigoberto Pipe is a 54 y.o. male here today for a follow up visit. Patient has history of hypertension, herpes, generalized anxiety disorder, chronic abdominal pain and low testosterone here today with major complaints of abdominal pain that has been going on for about 8 years. The pain with a recent contractor composition but most of the time in the lower part of the abdomen, has been variously investigated including CT abdomen and pelvis which showed no acute disease. Patient is requesting for stool to be checked for parasites as he remembers pain started after [place] years ago. No nausea or vomiting, no abdominal distention. No night sweats. No fever. Patient has No headache, No chest pain, No abdominal pain - No Nausea, No new weakness tingling or numbness, No Cough - SOB.  No problems updated.  ALLERGIES: Allergies  Allergen Reactions  . Doxycycline Itching    PAST MEDICAL HISTORY: Past Medical History  Diagnosis Date  . Low testosterone   . Hypertension   . Herpes     MEDICATIONS AT HOME: Prior to Admission medications   Medication Sig Start Date End Date Taking? Authorizing Provider  ALPRAZolam Prudy Feeler(XANAX) 1 MG tablet Take 1 tablet (1 mg total) by mouth at bedtime as needed for anxiety. 05/28/14  Yes Quentin Angstlugbemiga E Chrisanna Mishra, MD  metoprolol succinate (TOPROL-XL) 50 MG 24 hr tablet TAKE 1 TABLET BY MOUTH DAILY WITH OR IMMEDIATELY FOLLOWING A MEAL 09/01/14  Yes Laloni Rowton E Hyman HopesJegede, MD  omeprazole (PRILOSEC) 20 MG capsule TAKE 1 CAPSULE BY MOUTH DAILY. TAKE 30 MINUTES BEFORE BREAKFAST 08/31/14  Yes Krissie Merrick Annitta NeedsE Angles Trevizo, MD  traMADol (ULTRAM) 50 MG tablet Take 1 tablet (50 mg total) by mouth every 8 (eight)  hours as needed. 08/24/14  Yes Quentin Angstlugbemiga E Blayne Garlick, MD  acyclovir (ZOVIRAX) 400 MG tablet Take 1 tablet (400 mg total) by mouth daily. Patient not taking: Reported on 10/05/2014 05/28/14   Quentin Angstlugbemiga E Ahmyah Gidley, MD  Benzoyl Peroxide (ACNE MAXIMUM STRENGTH) 10 % CREA Apply once daily Patient not taking: Reported on 08/24/2014 10/07/13   Doris Cheadleeepak Advani, MD  cromolyn (OPTICROM) 4 % ophthalmic solution Place 2 drops into both eyes 5 (five) times daily. Patient not taking: Reported on 10/05/2014 09/18/14   Quentin Angstlugbemiga E Tylena Prisk, MD  cyclobenzaprine (FLEXERIL) 10 MG tablet Take 1 tablet (10 mg total) by mouth 3 (three) times daily as needed for muscle spasms. Patient not taking: Reported on 10/05/2014 02/16/14   Quentin Angstlugbemiga E Shanese Riemenschneider, MD  diclofenac (VOLTAREN) 75 MG EC tablet Take 1 tablet (75 mg total) by mouth 2 (two) times daily. Patient not taking: Reported on 10/05/2014 11/17/13   Ria ClockJennifer Lee H Presson, PA  diphenhydrAMINE (BENADRYL) 25 MG tablet Take 1 tablet (25 mg total) by mouth every 6 (six) hours as needed. Patient not taking: Reported on 10/05/2014 08/24/14   Quentin Angstlugbemiga E Era Parr, MD  predniSONE (DELTASONE) 20 MG tablet Take 1 tablet (20 mg total) by mouth daily with breakfast. Patient not taking: Reported on 10/05/2014 08/24/14   Quentin Angstlugbemiga E Kennedy Bohanon, MD  SUMAtriptan (IMITREX) 50 MG tablet Take 1 tablet (50 mg total) by mouth every 2 (two) hours as needed for migraine or headache. May repeat in 2  hours if headache persists or recurs. Patient not taking: Reported on 08/24/2014 01/05/14   Carney Living, MD  tamsulosin (FLOMAX) 0.4 MG CAPS capsule Take 1 capsule (0.4 mg total) by mouth daily. Patient not taking: Reported on 08/24/2014 02/09/14   Ambrose Finland, NP  testosterone cypionate (DEPO-TESTOSTERONE) 200 MG/ML injection Inject 1 mL (200 mg total) into the muscle every 28 (twenty-eight) days. Patient not taking: Reported on 10/05/2014 05/28/14   Quentin Angst, MD  triamcinolone cream (KENALOG) 0.1 %  Apply 1 application topically 2 (two) times daily. spanish Patient not taking: Reported on 08/24/2014 10/07/13   Doris Cheadle, MD     Objective:   Filed Vitals:   10/05/14 1553  BP: 143/98  Pulse: 117  Temp: 98.8 F (37.1 C)  TempSrc: Oral  Resp: 18  Height:  (1.676 m)  Weight: 215 lb (97.523 kg)  SpO2: 98%    Exam General appearance : Awake, alert, not in any distress. Speech Clear. Not toxic looking HEENT: Atraumatic and Normocephalic, pupils equally reactive to light and accomodation Neck: supple, no JVD. No cervical lymphadenopathy.  Chest:Good air entry bilaterally, no added sounds  CVS: S1 S2 regular, no murmurs.  Abdomen: Bowel sounds present, Non tender and not distended with no gaurding, rigidity or rebound. Extremities: B/L Lower Ext shows no edema, both legs are warm to touch Neurology: Awake alert, and oriented X 3, CN II-XII intact, Non focal Skin:No Rash Wounds:N/A  Data Review Lab Results  Component Value Date   HGBA1C 5.1 02/05/2014     Assessment & Plan   EKG done in the clinic today reviewed by me, shows sinus tachycardia.  1. Lower abdominal pain  - Urinalysis Dipstick - Lipase - Amylase - CBC with Differential/Platelet - COMPLETE METABOLIC PANEL WITH GFR - Lipid panel - Urinalysis, Complete - Cryptosporidium Antigen, Stool - Fecal leukocytes - Ova and parasite examination - Gastrointestinal Pathogen Panel PCR   Patient has been counseled extensively about nutrition and exercise Patient was again educated about his likely diagnosis and the possibility that nothing may be found with all the above tests, and if so we will advise probiotics over-the-counter as well as behavioral changes. Previous investigations including CT abdomen and pelvis were normal.  Interpreter was used to communicate directly with patient for the entire encounter including providing detailed patient instructions.   Return in about 6 months (around 04/05/2015),  or if symptoms worsen or fail to improve, for Abdominal Pain, Follow up Pain and comorbidities.  The patient was given clear instructions to go to ER or return to medical center if symptoms don't improve, worsen or new problems develop. The patient verbalized understanding. The patient was told to call to get lab results if they haven't heard anything in the next week.   This note has been created with Education officer, environmental. Any transcriptional errors are unintentional.    Jeanann Lewandowsky, MD, MHA, FACP, FAAP [facility] [place], [place] [phone]   10/05/2014, 4:28 PM

## 2014-10-05 NOTE — Progress Notes (Signed)
Pt here to f/u with lower abdominal pain radiating to left lower quad since 2008 States the pain is intermit achy,sharp pain with chills Denies n/v /or fevers Tachycardic-EKG done Denies chest pain or sob In house Spanish interpretor present

## 2014-10-06 LAB — URINALYSIS, COMPLETE
BILIRUBIN URINE: NEGATIVE
Bacteria, UA: NONE SEEN
Casts: NONE SEEN
Crystals: NONE SEEN
GLUCOSE, UA: NEGATIVE mg/dL
HGB URINE DIPSTICK: NEGATIVE
Ketones, ur: NEGATIVE mg/dL
LEUKOCYTES UA: NEGATIVE
Nitrite: NEGATIVE
Protein, ur: NEGATIVE mg/dL
Specific Gravity, Urine: <1.005 — ABNORMAL LOW (ref 1.005–1.030)
Squamous Epithelial / LPF: NONE SEEN
Urobilinogen, UA: 0.2 mg/dL (ref 0.0–1.0)
pH: 7 (ref 5.0–8.0)

## 2014-10-07 LAB — GASTROINTESTINAL PATHOGEN PANEL PCR
C. DIFFICILE TOX A/B, PCR: NEGATIVE
Campylobacter, PCR: NEGATIVE
Cryptosporidium, PCR: NEGATIVE
E COLI (ETEC) LT/ST, PCR: NEGATIVE
E coli (STEC) stx1/stx2, PCR: NEGATIVE
E coli 0157, PCR: NEGATIVE
Giardia lamblia, PCR: NEGATIVE
Norovirus, PCR: NEGATIVE
Rotavirus A, PCR: NEGATIVE
SALMONELLA, PCR: NEGATIVE
SHIGELLA, PCR: NEGATIVE

## 2014-10-07 LAB — OVA AND PARASITE EXAMINATION: OP: NONE SEEN

## 2014-10-07 LAB — CRYPTOSPORIDIUM ANTIGEN, STOOL: Cryptosporidium Screen (EIA): NEGATIVE

## 2014-10-08 ENCOUNTER — Telehealth: Payer: Self-pay | Admitting: Emergency Medicine

## 2014-10-08 NOTE — Telephone Encounter (Signed)
Left voice message to return call       Notes Recorded by Quentin Angstlugbemiga E Jegede, MD on 10/08/2014 at 11:09 AM Please inform patient that his intestinal pathogen panel is all negative.. Advise patient to drink plenty of water, engage in regular physical exercise, use over-the-counter probiotic as needed. There is no organic finding that can account for his abdominal pain, his CT scan is negative as well as all these tests.

## 2014-10-08 NOTE — Telephone Encounter (Signed)
-----   Message from Quentin Angstlugbemiga E Jegede, MD sent at 10/07/2014  4:42 PM EST ----- Please inform patient that his laboratory test results are mostly within normal limits except for his triglycerides, a form of cholesterol. Pancreatic enzymes are normal. Stool is negative for ova and parasites. We are awaiting 2 more tests results.  Please call in prescription for gemfibrozil 600 mg tablet by mouth twice a day, 60 tablets with 3 refills.

## 2014-10-13 ENCOUNTER — Telehealth: Payer: Self-pay | Admitting: Emergency Medicine

## 2014-10-13 ENCOUNTER — Other Ambulatory Visit: Payer: Self-pay | Admitting: Emergency Medicine

## 2014-10-13 MED ORDER — GEMFIBROZIL 600 MG PO TABS: 600.0000 mg | ORAL_TABLET | Freq: Two times a day (BID) | ORAL | Status: DC

## 2014-10-13 NOTE — Telephone Encounter (Signed)
Pt aware of lab CT results (infromacion was given in [place])

## 2014-10-13 NOTE — Telephone Encounter (Signed)
-----   Message from Quentin Angstlugbemiga E Jegede, MD sent at 10/08/2014 11:09 AM EST ----- Please inform patient that his intestinal pathogen panel is all negative.. Advise patient to drink plenty of water, engage in regular physical exercise, use over-the-counter probiotic as needed. There is no organic finding that can account for his abdominal pain, his CT scan is negative as well as all these tests.

## 2014-10-27 ENCOUNTER — Ambulatory Visit: Payer: Self-pay

## 2014-10-29 ENCOUNTER — Ambulatory Visit: Payer: Self-pay | Attending: Internal Medicine | Admitting: *Deleted

## 2014-10-29 DIAGNOSIS — R7989 Other specified abnormal findings of blood chemistry: Secondary | ICD-10-CM

## 2014-10-29 DIAGNOSIS — I10 Essential (primary) hypertension: Secondary | ICD-10-CM | POA: Insufficient documentation

## 2014-10-29 DIAGNOSIS — E291 Testicular hypofunction: Secondary | ICD-10-CM | POA: Insufficient documentation

## 2014-10-29 NOTE — Progress Notes (Signed)
Spoke with patient via in-house Interpreter,  Nathan Cherry Patient presents for testosterone injection States feeling well Last injection received 09/29/14 Next injection due 11/26/14  Labs from last visit reviewed with pt. Patient understands to increase fluid (water) intake, take probiotic, avoid fried fatty foods as well as any foods he knows irritates his stomach  NP in room during hx NP recommended patient try doubling omeprezole to see if that helps with burning sensation in stomach Patient will f/u with PCP if these changes do not help   Patient given literature on Fat and Cholesterol Control Diet

## 2014-10-29 NOTE — Patient Instructions (Signed)
Dieta para el control del colesterol y las grasas  (Fat and Cholesterol Control Diet) Los niveles de grasa y colesterol en la sangre y en los rganos se ven influidos por la dieta. Los niveles altos de grasa y colesterol pueden conducir a enfermedades del corazn, de los pequeos y los grandes vasos sanguneos, de la vescula biliar, el hgado y el pncreas.  CONTROL DE LA GRASA Y EL COLESTEROL CON LA DIETA  Aunque el ejercicio y el estilo de vida son factores importantes, su dieta es la clave. Esto se debe a que se sabe que ciertos alimentos hacen subir el colesterol y otros lo bajan. El objetivo debe ser equilibrar los alimentos, de modo que tengan un efecto sobre el colesterol y, an ms importante, reemplazar las grasas saturadas y trans con otros tipos de grasas, como las monoinsaturadas y las poliinsaturadas y cidos grasos omega-3.  En promedio, una persona no debe consumir ms de 15 a 17 g de grasas saturadas por da. Las grasas saturadas y trans se consideran grasas "malas", ya que elevan el colesterol LDL. Las grasas saturadas se encuentran principalmente en productos animales como carne, manteca y crema. Sin embargo, eso no significa que tenga que renunciar a todas sus comidas favoritas. Actualmente, hay buenos sustitutos bajos en colesterol, bajos en grasas para la mayora de las cosas que le gusta comer. Elija aquellos alimentos alternativos que sean bajos en grasas o sin grasas. Elija cortes de peceto o lomo de carne roja. Estos tipos de cortes contienen menos grasa y colesterol. Pollo (sin la piel), pescado, ternera y pechuga de pavo molida son excelentes opciones. Eliminar las carnes grasas, como las salchichas y el salame. Los mariscos contienen poca o casi nada de grasas saturadas. Consuma una porcin de 3 oz (85 g) de carne magra, aves o pescado.  Las grasas trans tambin se llaman "aceites parcialmente hidrogenados". Son aceites manipulados cientficamente de modo que son slidos a  temperatura ambiente, tienen una larga vida y mejoran el sabor y la textura de los alimentos a los que se agregan. Las grasas trans se encuentran en la margarina, masitas, crackers y alimentos horneados.  Al hornear y cocinar, el aceite es un buen sustituto de la manteca. Los aceites monoinsaturados son beneficiosos, sobre todo porque se cree que reducen el colesterol LDL y aumentan el HDL. Los aceites que hay que evitar completamente son los aceites tropicales saturados, como el de coco y palma.  Recuerde consumir una gran cantidad de alimentos de los grupos que estn naturalmente libres de grasas saturadas y grasas trans, e incluya pescado, frutas, verduras, frijoles, granos (cebada, arroz, cuscs, trigo bulgur) y pastas (sin salsas de crema).  IDENTIFICACIN DE LOS ALIMENTOS QUE REDUCEN LAS GRASAS Y ELCOLESTEROL  La fibra soluble puede reducir el colesterol. Este tipo de fibra se encuentra en las frutas como manzanas, verduras como el brcoli, papas y zanahorias, las legumbres como los frijoles, guisantes y lentejas y granos como la cebada. Los alimentos enriquecidos con esteroles vegetales (fitosteroles) tambin pueden reducir el colesterol. Consuma al menos 2 g por da de estos alimentos para un efecto de disminucin del colesterol.  Lea las etiquetas de los paquetes para identificar los alimentos bajos en grasas saturadas, en grasas trans y los bajos en grasas en el supermercado. Seleccione los quesos que tienen slo 2 a 3 g de grasa saturada por onza. Utilice margarina saludable para el corazn que sea libre de grasas trans o aceites parcialmente hidrogenados. Al comprar productos de panadera (galletas, crackers), se   deben evitar los aceites parcialmente hidrogenados. Panes y panecillos deben hacerse con cereales integrales (trigo integral o harina de avena integral en lugar de " harina " o " harina enriquecida ") Compre sopas en lata que no sean cremosas, con bajo contenido de sal y sin grasas  adicionadas.  TCNICAS DE PREPARACIN DE LOS ALIMENTOS  Nunca prepare los alimentos fritos. Si usted debe frer, saltee los alimentos en muy poca grasa o use un aerosol de cocina anti adherente. Siempre que sea posible, debe hervir, hornear o asar las carnes y preparar las verduras al vapor. En lugar de agregar mantequilla o margarina a las verduras, use limn y hierbas, pur de manzana y canela (para la calabaza y la batata). Utilice yogur natural sin grasa, salsas y aderezos bajos en grasa para ensaladas.  BAJO EN GRASAS SATURADAS / SUSTITUTOS BAJOS EN GRASA  Carnes / grasas saturadas (g)  Evite: Bife, veteado (3 oz/85 g) / 11 g  Elija: Bife, magro(3 oz/85 g) / 4 g  Evite: Hamburguesa (3 oz/85 g) / 7 g  Elija: Hamburguesa, magra (3 oz/85 g) / 5 g  Evite: Jamn (3 oz/85 g) / 6 g  Elija: Jamn, corte magro (3 oz/85 g) / 2,4 g  Evite: Pollo con piel, carne oscura (3 oz/85 g) / 4 g  Elija: Pollo, sin piel, carne oscura (3 oz/85 g) / 2 g  Evite: Pollo con piel, carne blanca (3 oz/85 g) / 2,5 g  Elija: Pollo, sin piel, carne blanca (3 oz/85 g) / 1 g Lcteos / Grasa saturada (g)   Evite: Leche entera (1 taza) / 5 g  Elija: Leche descremada, 2% (1 taza) / 3 g  Elija: Leche descremada, 1% (1 taza) / 1,5 g  Elija: Leche descremada, 1 taza (0,3 g).  Evite: Queso duro (1 oz/28 g) / 6 g  Elija: Queso de leche descremada (1 oz/28 g) / 2 a 3 g  Evite: Queso cottage, 4% de grasa (1 taza) / 6,5 g  Elija: Queso cottage bajo en grasa, 1% de grasa (1 taza) / 1,5 g  Evite: Helado (1 taza) / 9 g  Elija: Sorbete (1 taza) / 2,5 g  Elija: Yogur congelado descremado (1 taza) / 0,3 g  Elija: Barra de frutas congeladas / trace  Evite: Crema batida (1 cucharada) / 3,5 g  Elija: Crema batida no lctea (1 cucharada) / 1 g Condimentos / Grasas Saturadas (g)   Evite: Mayonesa (1 cucharada) / 2 g  Elija: Mayonesa baja en grasa (1 cucharada) / 1 g  Evite: Mantequilla (1 cucharada) / 7  g  Elija: Margarina light extra (1 cucharada) / 1 g  Evite: Aceite de coco (1 cucharada) / 11,8 g  Elija: Aceite de oliva (1 cucharada) / 1,8 g  Elija: Aceite de maz (1 cucharada) / 1,7 g  Elija: Aceite de crtamo (1 cucharada) / 1,2 g  Elija: Aceite de girasol (1 cucharada) / 1,4 g  Elija: Aceite de soja (1 cucharada) / 0 mg / 2,4 g  Elija: Aceite de canola (1 cucharada) / 0 mg / 1 g Document Released: 08/28/2005 Document Revised: 12/23/2012 ExitCare Patient Information 2015 [employer]. This information is not intended to replace advice given to you by your health care provider. Make sure you discuss any questions you have with your health care provider.  

## 2014-11-26 ENCOUNTER — Ambulatory Visit: Payer: Self-pay | Attending: Internal Medicine

## 2014-11-26 VITALS — BP 130/72 | HR 74 | Resp 16

## 2014-11-26 DIAGNOSIS — R7989 Other specified abnormal findings of blood chemistry: Secondary | ICD-10-CM

## 2014-11-26 MED ORDER — TESTOSTERONE CYPIONATE 100 MG/ML IM SOLN
200.0000 mg | INTRAMUSCULAR | Status: DC
  Administered 2014-11-26 – 2015-01-27 (×3): 200 mg via INTRAMUSCULAR

## 2014-11-26 MED ORDER — CROMOLYN SODIUM 4 % OP SOLN: 2.0000 [drp] | Freq: Every day | OPHTHALMIC | Status: DC

## 2014-11-26 NOTE — Progress Notes (Unsigned)
Patient ID: Nathan Cherry, male   DOB: 11/26/1959, 54 y.o.   MRN: [id number] Pt comes in today for Testosterone injection per scheduled time Blood pressure WNL Denies pain at this time In house Spanish interpretor present Eye drops refilled; [facility]

## 2014-11-26 NOTE — Patient Instructions (Signed)
Return with prescription at next scheduled visit Testosterone injection Qu es este medicamento? La TESTOSTERONA es la hormona masculina principal. Mantiene el desarrollo masculino normal, como el aumento de la masa muscular, el vello en el rostro y la voz grave. Se Medtronicutiliza en los hombres para tratar los niveles bajos de [place] Parktestosterona. Este medicamento puede ser utilizado para otros usos; si tiene alguna pregunta consulte con su proveedor de atencin mdica o con su farmacutico. MARCAS COMERCIALES DISPONIBLES: Andro-L.A., Aveed, Delatestryl, Depo-Testosterone, Virilon Qu le debo informar a mi profesional de la salud antes de tomar este medicamento? Necesita saber si usted presenta alguno de los siguientes problemas o situaciones: -cncer de mama -diabetes -enfermedad cardiaca -enfermedad renal -enfermedad heptica -enfermedad pulmonar -cncer de prstata, prstata dilatada -una reaccin alrgica o inusual a la testosterona, a otros medicamentos, alimentos, colorantes o conservantes -si est embarazada o buscando quedar embarazada -si est amamantando a un beb Cmo debo utilizar este medicamento? Este medicamento se administra mediante una inyeccin por va intramuscular. Generalmente lo administra un profesional de Radiographer, therapeuticla salud en un hospital o en un entorno clnico. Hable con su pediatra para informarse acerca del uso de este medicamento en nios. Aunque este medicamento ha sido recetado a nios tan menores como de 12 aos de edad para condiciones selectivas, las precauciones se aplican. Sobredosis: Pngase en contacto inmediatamente con un centro toxicolgico o una sala de urgencia si usted cree que haya tomado demasiado medicamento. ATENCIN: Reynolds AmericanEste medicamento es solo para usted. No comparta este medicamento con nadie. Qu sucede si me olvido de una dosis? Trate de no olvidar ninguna dosis. Su mdico o su profesional de Community education officerla salud le indicarn cuando es la prxima inyeccin. Notifique a la  clinica si no puede asistir a una cita. Qu puede interactuar con este medicamento? -medicamentos para la diabetes -medicamentos que tratan o previenen cogulos sanguneos, como warfarina -oxifenbutazona -propranolol -medicamentos esteroideos, como la prednisona o la cortisona Puede ser que esta lista no menciona todas las posibles interacciones. Informe a su profesional de Beazer Homesla salud de [employer] Inctodos los productos a base de hierbas, medicamentos de Trommaldventa libre o suplementos nutritivos que est tomando. Si usted fuma, consume bebidas alcohlicas o si utiliza drogas ilegales, indqueselo tambin a su profesional de Beazer Homesla salud. Algunas sustancias pueden interactuar con su medicamento. A qu debo estar atento al usar PPL Corporationeste medicamento? Visite a su mdico o a su profesional de la salud para chequear su evolucin peridicamente. Necesitarn verificar el nivel de testosterona en la [place]. Este medicamento slo est aprobado para su uso en hombres que tienen niveles bajos de testosterona relacionada con ciertas afecciones mdicas. Ha habido casos de ataques cardacos y derrames cerebrales con el uso de [place]este medicamento. Notifique a su mdico o profesional de la salud y busque tratamiento de emergencia si se desarrollan problemas respiratorios; cambios en la visin; confusin; dolor o presin en el pecho; dolor repentino del brazo; Engineer, miningdolor de Animatorcabeza severo, repentino; dificultad para hablar o comprender; repentino entumecimiento o debilidad de la cara, brazo o pierna; prdida del equilibrio o la coordinacin. Hable con su mdico Fortune Brandssobre los riesgos y beneficios de PPL Corporationeste medicamento. Este medicamento puede afectar su nivel de Bankerazcar en la sangre. Si es diabtico, consulte a su mdico o a su profesional de la salud antes de modificar su dieta o la dosis de su medicamento para la diabetes. La Harley-Davidsonmayora de las organizaciones de atletismo le prohben a los atletas el uso de [place]este medicamento. Qu efectos secundarios puedo tener al  Boston Scientificutilizar este medicamento? Efectos  secundarios que debe informar a su mdico o a Producer, television/film/video de la salud tan pronto como sea posible: -Therapist, art como erupcin cutnea, picazn o urticarias, hinchazn de la cara, labios o lengua -agrandamiento de los pechos -problemas respiratorios -cambios de humor, especialmente, ira, depresin o clera -orina oscura -sensacin general de estar enfermo o sntomas gripales -heces de color claro -prdida del apetito, nuseas -nuseas, vmito -dolor en la regin abdominal superior derecha -dolor estomacal -hinchazn de los tobillos -erecciones persistentes o frecuentes -dificultad para orinar o cambios en el volumen de orina -cansancio o debilidad inusual -color amarillento de los ojos o la piel Otros efectos secundarios que pueden aparecer en las mujeres: -voz grave o ronquera -crecimiento de vello en el rostro -perodos menstruales irregulares Efectos secundarios que, por lo general, no requieren Psychologist, prison and probation services (debe informarlos a su mdico o a Producer, television/film/video de la salud si persisten o si son molestos): -acn -cambios en el deseo sexual o capacidad -cada del cabello -dolor de cabeza Puede ser que esta lista no menciona todos los posibles efectos secundarios. Comunquese a su mdico por asesoramiento mdico [employer]lett[employer]. Usted puede informar los efectos secundarios a la FDA por telfono al 1-800-FDA-1088. Dnde debo guardar mi medicina? Mantngala fuera del alcance de los nios. Este medicamento puede ser abusado. Mantenga su medicamento en un lugar seguro para protegerlo contra robos. No comparta este medicamento con nadie. Es peligroso vender o Restaurant manager, fast food y est prohibido por la ley. Gurdela a temperatura ambiente de entre 20 y 25 grados C (4 y 33 grados F). No la congele. Protjala de la luz. Siga las instrucciones para le producto que se le haya recetado. Deseche todo el medicamento que no haya  utilizado, despus de la fecha de vencimiento. ATENCIN: Este folleto es un resumen. Puede ser que no cubra toda la posible informacin. Si usted tiene preguntas acerca de esta medicina, consulte con su mdico, su farmacutico o su profesional de Radiographer, therapeutic.  2015, Elsevier/Gold Standard. (2013-12-03 13:22:48)

## 2014-12-25 ENCOUNTER — Ambulatory Visit: Payer: Self-pay

## 2014-12-28 ENCOUNTER — Ambulatory Visit: Payer: Self-pay | Attending: Internal Medicine | Admitting: *Deleted

## 2014-12-28 VITALS — BP 129/78 | HR 100 | Temp 98.2°F | Resp 20

## 2014-12-28 DIAGNOSIS — R7989 Other specified abnormal findings of blood chemistry: Secondary | ICD-10-CM

## 2014-12-28 DIAGNOSIS — E291 Testicular hypofunction: Secondary | ICD-10-CM | POA: Insufficient documentation

## 2014-12-28 NOTE — Progress Notes (Signed)
Spoke with patient via Astronomern-house Interpreter, Nathan Cherry Patient presents for Depo Testosterone injection States feeling well Last injection received 11/26/2014 Next injection due 01/25/2015  BP 129/78 P 100 R 20 T 98.2 oral SpO2 97%  Per PCP: Patient to have total of 5 monthly testosterone injections (Jan - May, 2016) Repeat testosterone and LFTs at 6 months (June, 2016)

## 2015-01-25 ENCOUNTER — Ambulatory Visit: Payer: Self-pay

## 2015-01-27 ENCOUNTER — Ambulatory Visit: Payer: Self-pay | Attending: Internal Medicine | Admitting: *Deleted

## 2015-01-27 VITALS — BP 130/76 | HR 72

## 2015-01-27 DIAGNOSIS — R7989 Other specified abnormal findings of blood chemistry: Secondary | ICD-10-CM

## 2015-01-27 DIAGNOSIS — E291 Testicular hypofunction: Secondary | ICD-10-CM | POA: Insufficient documentation

## 2015-01-27 NOTE — Progress Notes (Signed)
Spoke with patient via Astronomern-house Interpreter, Belen Patient presents for Depo Testosterone injection States feeling well except for mild headache; thinks it's due to eating watermelon yesterday which he says "bothers my nerves" Last injection received 418//18/16 Future lab order placed for testosterone and LFTs per PCP Patient will return around 02/24/15 for blood work  Filed Vitals:   01/27/15 1549  BP: 130/76  Pulse: 72

## 2015-02-24 ENCOUNTER — Other Ambulatory Visit: Payer: Self-pay

## 2015-02-26 ENCOUNTER — Ambulatory Visit: Payer: Self-pay | Attending: Internal Medicine

## 2015-02-26 DIAGNOSIS — R7989 Other specified abnormal findings of blood chemistry: Secondary | ICD-10-CM

## 2015-02-26 LAB — HEPATIC FUNCTION PANEL
ALBUMIN: 4.2 g/dL (ref 3.5–5.2)
ALK PHOS: 60 U/L (ref 39–117)
ALT: 25 U/L (ref 0–53)
AST: 21 U/L (ref 0–37)
BILIRUBIN DIRECT: 0.1 mg/dL (ref 0.0–0.3)
BILIRUBIN TOTAL: 0.5 mg/dL (ref 0.2–1.2)
Indirect Bilirubin: 0.4 mg/dL (ref 0.2–1.2)
TOTAL PROTEIN: 6.6 g/dL (ref 6.0–8.3)

## 2015-02-27 LAB — TESTOSTERONE: Testosterone: 181 ng/dL — ABNORMAL LOW (ref 300–890)

## 2015-05-27 ENCOUNTER — Telehealth: Payer: Self-pay

## 2015-05-27 DIAGNOSIS — R7989 Other specified abnormal findings of blood chemistry: Secondary | ICD-10-CM

## 2015-05-27 MED ORDER — TESTOSTERONE CYPIONATE 200 MG/ML IM SOLN: 200.0000 mg | INTRAMUSCULAR | Status: DC

## 2015-05-27 NOTE — Telephone Encounter (Signed)
Patient came into clinic to request lab results. Nurse spoke with patient via [employer], Interpreter [phone]. Patient verified date of birth. Patient aware of liver function test normal and testosterone level is still low. Patient aware of need to continue testosterone injections monthly. Patient questioned testosterone level, aware of level 181. Patient agrees to continue testosterone injections monthly.  Nurse printed prescription and provider signed. Patient aware of taking prescription to pharmacy and bringing medication with him to nurse visit for testosterone injection.  Patient voices understanding and has no further questions at this time.

## 2015-06-01 ENCOUNTER — Ambulatory Visit: Payer: Self-pay | Attending: Internal Medicine

## 2015-06-01 VITALS — HR 100 | Temp 98.4°F | Resp 18 | Ht 66.0 in | Wt 210.0 lb

## 2015-06-01 DIAGNOSIS — E291 Testicular hypofunction: Secondary | ICD-10-CM | POA: Insufficient documentation

## 2015-06-01 DIAGNOSIS — R7989 Other specified abnormal findings of blood chemistry: Secondary | ICD-10-CM

## 2015-06-01 MED ORDER — TESTOSTERONE CYPIONATE 200 MG/ML IM SOLN
200.0000 mg | Freq: Once | INTRAMUSCULAR | Status: AC
  Administered 2015-06-01: 200 mg via INTRAMUSCULAR

## 2015-06-01 NOTE — Progress Notes (Signed)
Patient is here for 1st of series of 3 testosterone injections.

## 2015-06-01 NOTE — Patient Instructions (Signed)
Please return for your 2nd testosterone injection on 06/29/15. We will redraw testosterone level after the 3rd injection.

## 2015-06-29 ENCOUNTER — Ambulatory Visit: Payer: Self-pay

## 2015-07-05 ENCOUNTER — Ambulatory Visit: Payer: Self-pay | Attending: Internal Medicine | Admitting: Pharmacist

## 2015-07-05 ENCOUNTER — Other Ambulatory Visit: Payer: Self-pay

## 2015-07-05 DIAGNOSIS — E291 Testicular hypofunction: Secondary | ICD-10-CM | POA: Insufficient documentation

## 2015-07-05 DIAGNOSIS — R7989 Other specified abnormal findings of blood chemistry: Secondary | ICD-10-CM

## 2015-07-05 MED ORDER — TESTOSTERONE CYPIONATE 100 MG/ML IM SOLN
200.0000 mg | Freq: Once | INTRAMUSCULAR | Status: AC
  Administered 2015-07-05: 200 mg via INTRAMUSCULAR

## 2015-07-05 NOTE — Progress Notes (Signed)
Patient here for his second testosterone injection in  A series of three

## 2015-07-05 NOTE — Patient Instructions (Signed)
Return the week of November 21st for your third testosterone injection

## 2015-07-22 ENCOUNTER — Telehealth: Payer: Self-pay | Admitting: Internal Medicine

## 2015-07-22 NOTE — Telephone Encounter (Signed)
07/22/15 10:40 am Patient came in to request dosage change if possible for Acyclovin 400 mg tablet 2 times a day with meals to Acyclovin 200 mg tablet . Patient states that 400mg  dose is over $30.00 and the 200mg  dose is around $13.00 and he doesn't want to pay that high price. He was told at the pharmacy that he needs to contact his PCP to authorize this change if possible. Pt. Has FU on 07/27/15 @ 10:30 am[facility] [address].  Please advise

## 2015-07-27 ENCOUNTER — Encounter: Payer: Self-pay | Admitting: Internal Medicine

## 2015-07-27 ENCOUNTER — Ambulatory Visit (INDEPENDENT_AMBULATORY_CARE_PROVIDER_SITE_OTHER): Payer: Self-pay | Admitting: Internal Medicine

## 2015-07-27 VITALS — BP 140/90 | HR 76 | Ht 65.5 in | Wt 205.0 lb

## 2015-07-27 DIAGNOSIS — R21 Rash and other nonspecific skin eruption: Secondary | ICD-10-CM

## 2015-07-27 DIAGNOSIS — A6000 Herpesviral infection of urogenital system, unspecified: Secondary | ICD-10-CM | POA: Insufficient documentation

## 2015-07-27 DIAGNOSIS — N521 Erectile dysfunction due to diseases classified elsewhere: Secondary | ICD-10-CM

## 2015-07-27 DIAGNOSIS — F411 Generalized anxiety disorder: Secondary | ICD-10-CM

## 2015-07-27 DIAGNOSIS — I10 Essential (primary) hypertension: Secondary | ICD-10-CM

## 2015-07-27 MED ORDER — ACYCLOVIR 200 MG PO CAPS: ORAL_CAPSULE | ORAL | Status: DC

## 2015-07-27 NOTE — Progress Notes (Signed)
   Subjective:    Patient ID: Nathan Cherry, male    DOB: [birth year], 54 y.o.   MRN: [id number]  HPI  1.  Herpes suppression:  Needs Acyclovir changed to 200 mg 2 tabs  Twice daily.  Pt. States he actually only takes 400 mg once daily for past 2 years.  Unfortunately, having 4 mild outbreaks yearly.  2.  Hyperlipidemia:  Had cholesterol checked in January--improved, though not at goal.  Liver enzymes tested in March and normal.  Pt. Is not fasting today.  States he takes twice daily, but decided to take a rest about 5 days ago and stopped.    3.  Hypertension:  Has missed bp medication sometimes(only twice) during the past month.  Generally, misses in morning as is not consistent with eating breakfast.  Later, becomes clear he is only taking Metoprolol in the morning.  Describes only eating carbs in the morning.  States he is physically active--walks with dog, goes to the gym.   4.  Allergic conjunctivitis:  Not taking Allegra.  Does not feel his eyes are bothering him.  5.  Chalazion vs hordeolum at last visit:  Resolved with warm compresses.  6.  IBS and Anxiety:  Taking Sertraline 25 mg daily and feels he is doing well.  Discussed we have a Spanish speaking counselor now, but he does not feel counseling is necessary.    7.  Rash on back of neck:  Bumps on back of neck for about 1 week.  Itches.  Has had in past.  Has used Triamcinolone cream twice daily and that has helped.  States after haircut, this seems to occur.    8.  Pt. States he gets anxious with intercourse.  Unable to get a good erection.  Does have a history of low testosterone and [facility]  That and Cialis (which he was taking before getting the testosterone)  Both helped with erection.   Pt. Cannot say what clinic he received the Cialis from--sounds like received a one time prescription for Cialis in the past.  Received testosterone shot per patient one month ago.   Once again,  discussed with patient that he needs to choose one primary care.  It is not safe for him or providers to go back and forth.       Review of Systems     Objective:   Physical Exam  Skin, back of neck:  Mildly swollen outward neck fold at posterior hairline with mild erythema, no increased warmth in the area. Lungs:  CTA CV:  RRR without murmur or rub, radial and DP pulses normal and equal Extrems:  No edema         Assessment & Plan:  1.  Genital Herpes Suppression:  Switched to 200 mg caps for better affordability.  Discussed he should be taking twice daily  2.  Anxiety:  Possibly ED due to this.  Counseling with Natosha Knight--warm hand off.  Symptoms otherwise well controlled on Sertraline  3.  Hypertension: to take Metoprolol twice daily--noncompliant  4.  Low testosterone:  Pt. To decide what clinic he will continue with  5.  Rash:  Actually appears like a sunburn or contact irritation of some sort on back of neck.  To call if does not improve over next week.  Cool compresses to area as needed

## 2015-07-27 NOTE — Patient Instructions (Signed)
You need to choose one office to be your primary care--this is the last visit you will have with us if you continue to go [facility] and [facility]. Bring all of your medicine bottle to each visit.

## 2015-08-02 ENCOUNTER — Ambulatory Visit: Payer: Self-pay

## 2015-08-02 NOTE — Telephone Encounter (Signed)
Taken care of at last visit subsequent to phone call

## 2015-08-04 ENCOUNTER — Ambulatory Visit: Payer: Self-pay | Attending: Internal Medicine

## 2015-08-04 VITALS — BP 130/75 | HR 75 | Temp 98.4°F | Resp 18 | Ht 66.0 in | Wt 211.0 lb

## 2015-08-04 DIAGNOSIS — R7989 Other specified abnormal findings of blood chemistry: Secondary | ICD-10-CM

## 2015-08-04 DIAGNOSIS — E291 Testicular hypofunction: Secondary | ICD-10-CM | POA: Insufficient documentation

## 2015-08-04 MED ORDER — TESTOSTERONE CYPIONATE 200 MG/ML IM SOLN
200.0000 mg | Freq: Once | INTRAMUSCULAR | Status: AC
  Administered 2015-08-04: 200 mg via INTRAMUSCULAR

## 2015-08-04 NOTE — Progress Notes (Signed)
Patient is here for 3rd testosterone injection.

## 2015-08-10 ENCOUNTER — Other Ambulatory Visit: Payer: Self-pay | Admitting: Licensed Clinical Social Worker

## 2015-08-16 ENCOUNTER — Other Ambulatory Visit: Payer: Self-pay | Admitting: Licensed Clinical Social Worker

## 2015-08-19 ENCOUNTER — Telehealth: Payer: Self-pay | Admitting: Licensed Clinical Social Worker

## 2015-08-19 NOTE — Telephone Encounter (Signed)
LCSW contacted pt regarding his two recent no-shows. This was the 3rd attempt to make contact and reschedule.   By Nilda SimmerNatosha Ilay Capshaw, LCSW

## 2015-09-03 ENCOUNTER — Ambulatory Visit: Payer: Self-pay | Attending: Internal Medicine | Admitting: Pharmacist

## 2015-09-03 DIAGNOSIS — R7989 Other specified abnormal findings of blood chemistry: Secondary | ICD-10-CM

## 2015-09-04 LAB — TESTOSTERONE: TESTOSTERONE: 172 ng/dL — AB (ref 300–890)

## 2015-09-28 ENCOUNTER — Telehealth: Payer: Self-pay | Admitting: *Deleted

## 2015-09-28 DIAGNOSIS — R7989 Other specified abnormal findings of blood chemistry: Secondary | ICD-10-CM

## 2015-09-28 NOTE — Telephone Encounter (Signed)
Medical Assistant used [employer] to contact patient.  Interpreter Name: Westley Hummer #: [id number]  Patient verified DOB Patient was informed of testosterone level from 09/03/15 being low. Patient would like to know if he needs to begin another series.

## 2015-09-30 MED ORDER — TESTOSTERONE CYPIONATE 200 MG/ML IM SOLN: 200.0000 mg | INTRAMUSCULAR | Status: DC

## 2015-09-30 NOTE — Telephone Encounter (Signed)
-----   Message from Quentin Angst, MD sent at 09/28/2015  4:16 PM EST ----- Please inform patient that his testosterone level is low to begin another series of injection testosterone IM 200 mg every 4 weeks

## 2015-09-30 NOTE — Telephone Encounter (Signed)
Medical Assistant used [employer] to contact patient.  Interpreter Name: Evalee Jefferson Interpreter #: [phone] Patient verified DOB Patient informed of needing another series of testosterone shots. Patient scheduled for a nurse visit on 10/01/2015 at 10:30 am for the first injection. Patient expressed his understanding and had no further questions.

## 2015-10-01 ENCOUNTER — Ambulatory Visit: Payer: Self-pay | Attending: Internal Medicine

## 2015-10-01 VITALS — BP 134/73 | HR 91 | Temp 98.2°F | Resp 18 | Ht 66.0 in | Wt 213.0 lb

## 2015-10-01 DIAGNOSIS — E291 Testicular hypofunction: Secondary | ICD-10-CM | POA: Insufficient documentation

## 2015-10-01 DIAGNOSIS — R7989 Other specified abnormal findings of blood chemistry: Secondary | ICD-10-CM

## 2015-10-01 MED ORDER — TESTOSTERONE CYPIONATE 200 MG/ML IM SOLN
200.0000 mg | Freq: Once | INTRAMUSCULAR | Status: AC
  Administered 2015-10-01: 200 mg via INTRAMUSCULAR

## 2015-10-01 NOTE — Progress Notes (Signed)
Patient here for testosterone injection, first injection in series of three.   Patient reports having headache, currently at level 4. Patient thinks it is related to the coffee he drank this morning and his teeth. Headache started this morning. Patient reports having teeth cleaned in December. Nurse advised patient to make appointment with provider if headache does not get better.  Patient to come back in 28 days for 2nd testosterone injection.

## 2015-11-01 ENCOUNTER — Ambulatory Visit: Payer: Self-pay

## 2015-11-12 ENCOUNTER — Ambulatory Visit: Payer: Self-pay | Attending: Internal Medicine

## 2015-11-12 DIAGNOSIS — R7989 Other specified abnormal findings of blood chemistry: Secondary | ICD-10-CM

## 2015-11-12 MED ORDER — TESTOSTERONE CYPIONATE 200 MG/ML IM SOLN
200.0000 mg | Freq: Once | INTRAMUSCULAR | Status: AC
  Administered 2015-11-12: 200 mg via INTRAMUSCULAR

## 2015-11-12 MED ORDER — OMEPRAZOLE 20 MG PO CPDR: DELAYED_RELEASE_CAPSULE | ORAL | Status: DC

## 2015-11-12 NOTE — Progress Notes (Unsigned)
Patient here for his testosterone injection Lot # [id number] Expires 03/2017

## 2015-12-10 ENCOUNTER — Ambulatory Visit: Payer: Self-pay | Attending: Internal Medicine

## 2015-12-10 DIAGNOSIS — R7989 Other specified abnormal findings of blood chemistry: Secondary | ICD-10-CM

## 2015-12-10 DIAGNOSIS — E291 Testicular hypofunction: Secondary | ICD-10-CM | POA: Insufficient documentation

## 2015-12-10 MED ORDER — TESTOSTERONE CYPIONATE 200 MG/ML IM SOLN
200.0000 mg | Freq: Once | INTRAMUSCULAR | Status: AC
  Administered 2015-12-10: 200 mg via INTRAMUSCULAR

## 2015-12-22 ENCOUNTER — Ambulatory Visit: Payer: Self-pay

## 2016-01-10 ENCOUNTER — Other Ambulatory Visit: Payer: Self-pay

## 2016-01-13 ENCOUNTER — Other Ambulatory Visit: Payer: Self-pay | Admitting: Internal Medicine

## 2016-01-13 ENCOUNTER — Ambulatory Visit: Payer: Self-pay | Attending: Internal Medicine

## 2016-01-13 DIAGNOSIS — R7989 Other specified abnormal findings of blood chemistry: Secondary | ICD-10-CM

## 2016-01-14 LAB — TESTOSTERONE TOTAL,FREE,BIO, MALES
ALBUMIN: 4.4 g/dL (ref 3.6–5.1)
SEX HORMONE BINDING: 27 nmol/L (ref 22–77)
TESTOSTERONE BIOAVAILABLE: 77.9 ng/dL — AB (ref 130.5–681.7)
TESTOSTERONE FREE: 38.7 pg/mL — AB (ref 47.0–244.0)
TESTOSTERONE: 262 ng/dL (ref 250–827)

## 2016-01-20 ENCOUNTER — Telehealth: Payer: Self-pay | Admitting: Internal Medicine

## 2016-01-20 ENCOUNTER — Ambulatory Visit: Payer: Self-pay | Attending: Internal Medicine

## 2016-01-20 NOTE — Telephone Encounter (Signed)
Pt. Called requesting his Lab results. Please f/u with pt.

## 2016-02-04 ENCOUNTER — Ambulatory Visit: Payer: Self-pay | Attending: Internal Medicine | Admitting: Internal Medicine

## 2016-02-04 DIAGNOSIS — E291 Testicular hypofunction: Secondary | ICD-10-CM | POA: Insufficient documentation

## 2016-02-04 DIAGNOSIS — R7989 Other specified abnormal findings of blood chemistry: Secondary | ICD-10-CM

## 2016-02-04 MED ORDER — TESTOSTERONE CYPIONATE 200 MG/ML IM SOLN
200.0000 mg | Freq: Once | INTRAMUSCULAR | Status: AC
  Administered 2016-02-04: 200 mg via INTRAMUSCULAR

## 2016-02-15 NOTE — Telephone Encounter (Signed)
Medical Assistant used [employer] to contact patient.  Interpreter Name: Winferd HumphreyJuara Interpreter #: [phone] Patient is aware of total testosterone being within normal limits. Patients free testosterone is still slightly low. Patient is aware of continuing the regimen. No further questions at this time.

## 2016-02-15 NOTE — Telephone Encounter (Signed)
-----   Message from Quentin Angstlugbemiga E Jegede, MD sent at 02/02/2016 12:37 PM EDT ----- Please inform patient that his total testosterone level is within normal limits but the free testosterone is still slightly low. Continue current regimen.

## 2016-02-15 NOTE — Progress Notes (Signed)
ERRONEOUS ENCOUNTER, PLEASE DISREGARD

## 2016-03-06 ENCOUNTER — Ambulatory Visit: Payer: Self-pay | Attending: Internal Medicine

## 2016-03-06 DIAGNOSIS — E291 Testicular hypofunction: Secondary | ICD-10-CM | POA: Insufficient documentation

## 2016-03-06 DIAGNOSIS — R7989 Other specified abnormal findings of blood chemistry: Secondary | ICD-10-CM

## 2016-03-06 MED ORDER — TESTOSTERONE CYPIONATE 200 MG/ML IM SOLN
200.0000 mg | INTRAMUSCULAR | Status: DC
  Administered 2016-03-06 – 2016-05-04 (×2): 200 mg via INTRAMUSCULAR

## 2016-03-06 NOTE — Progress Notes (Signed)
Patient here today for Depotestosterone cypionate.  Received last Depo on 02/04/2016 and is due for Depo today. Testosterone Cypionate 200 mg given in RUOQ. Patient tolerated injection well. Due for next injection 28 days.  Reminder card given to patient. Pollyann KennedyKim Kelliann Pendergraph, RN, BSN

## 2016-04-03 ENCOUNTER — Ambulatory Visit: Payer: Self-pay | Attending: Internal Medicine

## 2016-04-03 DIAGNOSIS — E291 Testicular hypofunction: Secondary | ICD-10-CM | POA: Insufficient documentation

## 2016-04-03 DIAGNOSIS — R7989 Other specified abnormal findings of blood chemistry: Secondary | ICD-10-CM

## 2016-04-03 NOTE — Progress Notes (Signed)
Patient here today for Depotestosterone cypionate.  Received last Depo on 03/06/2016  and is due for Depo today. Testosterone Cypionate 200 mg given in LUOQ. Patient tolerated injection well. Due for next injection 28 days.  Patient to call for an appt.

## 2016-05-04 ENCOUNTER — Ambulatory Visit: Payer: Self-pay | Attending: Internal Medicine

## 2016-05-04 ENCOUNTER — Telehealth: Payer: Self-pay

## 2016-05-04 DIAGNOSIS — R7989 Other specified abnormal findings of blood chemistry: Secondary | ICD-10-CM

## 2016-05-04 DIAGNOSIS — E291 Testicular hypofunction: Secondary | ICD-10-CM

## 2016-05-04 NOTE — Patient Instructions (Signed)
Patient tolerated injection well. Due for next injection 28 days. Patient to call for an appt.  Also requested BP Check: BP 100/72.  Patient presents also with infected hair follicle, states he has appointment in two weeks.  Advised to keep appointment with provider.  Also advised not to squeeze and keep area clean.  Also provided some triple antibiotic ointment; advised to apply to area bid using clean technique

## 2016-05-04 NOTE — Progress Notes (Signed)
Patient here for Testosterone injection-Patient here today for Depotestosterone cypionate. Received last Depo on 04/03/2016  and is due for Depo today. Testosterone Cypionate 200 mg given in RUOQ. Patient tolerated injection well. Due for next injection 28 days. Patient to call for an appt.  Also requested BP Check: BP 100/72.  Patient presents also with infected hair follicle, states he has appointment in two weeks.  Advised to keep appointment with provider.  Also advised not to squeeze and keep area clean.  Also provided some triple antibiotic ointment; advised to apply to area bid using clean technique.    Patient due for refill for testosterone injection refill;  Rn advised patient will send request for refill to PCP; Patient verbalized understanding.  Request refill be sent to: [facility], telephone [phone]. Pollyann KennedyKim Becton, RN, BSN

## 2016-05-04 NOTE — Telephone Encounter (Signed)
Patient here for Testosterone Injection 05/04/16.  Now needs a refill for testosterone cypionate (DEPOTESTORONE CYPIONATE) injection 200mg  q 28 days, (individual vials).

## 2016-05-31 ENCOUNTER — Ambulatory Visit: Payer: Self-pay

## 2016-06-05 ENCOUNTER — Telehealth: Payer: Self-pay | Admitting: Internal Medicine

## 2016-06-05 ENCOUNTER — Other Ambulatory Visit: Payer: Self-pay | Admitting: Internal Medicine

## 2016-06-05 NOTE — Telephone Encounter (Signed)
Patient needs testosterone refill

## 2016-06-05 NOTE — Telephone Encounter (Signed)
This is a controlled substance - will forward request to Dr. Hyman HopesJegede.

## 2016-06-06 ENCOUNTER — Other Ambulatory Visit: Payer: Self-pay | Admitting: Internal Medicine

## 2016-06-06 ENCOUNTER — Telehealth: Payer: Self-pay | Admitting: Internal Medicine

## 2016-06-06 DIAGNOSIS — N400 Enlarged prostate without lower urinary tract symptoms: Secondary | ICD-10-CM

## 2016-06-06 MED ORDER — TAMSULOSIN HCL 0.4 MG PO CAPS: 0.4000 mg | ORAL_CAPSULE | Freq: Every day | ORAL | 3 refills | Status: DC

## 2016-06-06 NOTE — Telephone Encounter (Signed)
Tamsulosin refilled.

## 2016-06-06 NOTE — Telephone Encounter (Signed)
Pt came into office requesting med refill on tamsulosin (FLOMAX) 0.4 MG CAPS capsule Please f/up

## 2016-06-12 ENCOUNTER — Ambulatory Visit: Payer: Self-pay

## 2016-06-14 ENCOUNTER — Telehealth: Payer: Self-pay

## 2016-06-14 ENCOUNTER — Telehealth: Payer: Self-pay | Admitting: Internal Medicine

## 2016-06-14 DIAGNOSIS — N400 Enlarged prostate without lower urinary tract symptoms: Secondary | ICD-10-CM

## 2016-06-14 MED ORDER — TAMSULOSIN HCL 0.4 MG PO CAPS: 0.4000 mg | ORAL_CAPSULE | Freq: Every day | ORAL | 3 refills | Status: DC

## 2016-06-14 NOTE — Telephone Encounter (Signed)
Tamsulosin sent to [facility]

## 2016-06-14 NOTE — Telephone Encounter (Signed)
Pt was called on 10/ 4 and no VM was set up. Script will be up front in the sign out book.

## 2016-06-14 NOTE — Telephone Encounter (Signed)
Patient called last week to request refill for tamsulosin (FLOMAX) 0.4 MG CAPS capsule. Prescription was sent to wrong pharmacy. Pt would like rx to be sent to [facility].  Please follow up.  Thank you.

## 2016-06-19 ENCOUNTER — Ambulatory Visit: Payer: Self-pay

## 2016-07-14 ENCOUNTER — Other Ambulatory Visit: Payer: Self-pay | Admitting: Internal Medicine

## 2016-07-28 ENCOUNTER — Ambulatory Visit: Payer: Self-pay | Admitting: Internal Medicine

## 2017-01-30 ENCOUNTER — Encounter (HOSPITAL_COMMUNITY): Payer: Self-pay | Admitting: Emergency Medicine

## 2017-01-30 ENCOUNTER — Encounter (HOSPITAL_COMMUNITY): Payer: Self-pay

## 2017-01-30 ENCOUNTER — Emergency Department (HOSPITAL_COMMUNITY)
Admission: EM | Admit: 2017-01-30 | Discharge: 2017-01-30 | Disposition: A | Discharge status: Home / Self Care | Payer: Self-pay | Attending: Emergency Medicine | Admitting: Emergency Medicine

## 2017-01-30 ENCOUNTER — Ambulatory Visit (HOSPITAL_COMMUNITY)
Admission: EM | Admit: 2017-01-30 | Discharge: 2017-01-30 | Disposition: A | Discharge status: Nursing Home (Includes ICF) | Payer: Self-pay | Attending: Family Medicine | Admitting: Family Medicine

## 2017-01-30 ENCOUNTER — Telehealth: Payer: Self-pay | Admitting: *Deleted

## 2017-01-30 ENCOUNTER — Emergency Department (HOSPITAL_COMMUNITY): Payer: Self-pay

## 2017-01-30 DIAGNOSIS — S61213A Laceration without foreign body of left middle finger without damage to nail, initial encounter: Secondary | ICD-10-CM

## 2017-01-30 DIAGNOSIS — Y9289 Other specified places as the place of occurrence of the external cause: Secondary | ICD-10-CM | POA: Insufficient documentation

## 2017-01-30 DIAGNOSIS — W268XXA Contact with other sharp object(s), not elsewhere classified, initial encounter: Secondary | ICD-10-CM | POA: Insufficient documentation

## 2017-01-30 DIAGNOSIS — Y9389 Activity, other specified: Secondary | ICD-10-CM | POA: Insufficient documentation

## 2017-01-30 DIAGNOSIS — I10 Essential (primary) hypertension: Secondary | ICD-10-CM | POA: Insufficient documentation

## 2017-01-30 DIAGNOSIS — Y990 Civilian activity done for income or pay: Secondary | ICD-10-CM | POA: Insufficient documentation

## 2017-01-30 DIAGNOSIS — S61211A Laceration without foreign body of left index finger without damage to nail, initial encounter: Secondary | ICD-10-CM

## 2017-01-30 DIAGNOSIS — Z79899 Other long term (current) drug therapy: Secondary | ICD-10-CM | POA: Insufficient documentation

## 2017-01-30 MED ORDER — LIDOCAINE HCL 2 % IJ SOLN
INTRAMUSCULAR | Status: AC
  Filled 2017-01-30: qty 20

## 2017-01-30 NOTE — Consult Note (Signed)
ORTHOPAEDIC CONSULTATION HISTORY & PHYSICAL REQUESTING PHYSICIAN: Elvina Sidle, MD  Chief Complaint: left index and long finger lacerations  HPI: Nathan Cherry is a 56 y.o. male who presented today for evaluation, having lacerated the volar surfaces of the left index and long fingers yesterday at work on a piece of metal in [employer].  In the course of evaluation, it was noted that he lacked the ability to flex the DIP joint of the index finger.  He reports that his injury is not covered under [employer].    Past Medical History:  Diagnosis Date  . Allergy   . Anxiety    History of anger issues related to this.    Marland Kitchen BPH without obstruction/lower urinary tract symptoms    Has had in past--but has been asymptomatic in 2016  . External hemorrhoids   . Genital herpes   . Herpes   . Hyperlipidemia   . Hypertension   . IBS (irritable bowel syndrome)    Associated with anxiety  . Low testosterone   . PUD (peptic ulcer disease) 2014   History reviewed. No pertinent surgical history. Social History   Social History  . Marital status: Single    Spouse name: never married  . Number of children: 3  . Years of education: 9   Occupational History  . Drywall finisher    Social History Main Topics  . Smoking status: Never Smoker  . Smokeless tobacco: Never Used  . Alcohol use No     Comment: No alcohol since 2005.  History of drinking too much.  . Drug use: No  . Sexual activity: Yes    Birth control/ protection: Condom   Other Topics Concern  . None   Social History Narrative   Lives at home with 3 children   Does have a girlfriend--do not live together.     [place]   Came to [employer]. In [place]   History reviewed. No pertinent family history. Allergies  Allergen Reactions  . Doxycycline Itching   Prior to Admission medications   Medication Sig Start Date End Date Taking? Authorizing Provider  acyclovir (ZOVIRAX) 200 MG capsule 2  capsules by mouth twice daily 07/27/15   Julieanne Manson, MD  DEPO-TESTOSTERONE 200 MG/ML injection INJECT 1 ML (200 MG TOTAL) INTO THE MUSCLE EVERY 28 DAYS 06/14/16   Quentin Angst, MD  gemfibrozil (LOPID) 600 MG tablet TAKE ONE TABLET BY MOUTH TWICE DAILY WITH MEALS 07/18/16   Julieanne Manson, MD  metoprolol tartrate (LOPRESSOR) 25 MG tablet TAKE ONE TABLET BY MOUTH TWICE DAILY WITH MEALS 07/18/16   Julieanne Manson, MD  omeprazole (PRILOSEC) 20 MG capsule TAKE 1 CAPSULE BY MOUTH DAILY. TAKE 30 MINUTES BEFORE BREAKFAST 11/12/15   Quentin Angst, MD  sertraline (ZOLOFT) 50 MG tablet TAKE ONE-HALF TABLET BY MOUTH ONCE DAILY BEFORE MEAL(S) 07/18/16   Julieanne Manson, MD  SUMAtriptan (IMITREX) 50 MG tablet Take 1 tablet (50 mg total) by mouth every 2 (two) hours as needed for migraine or headache. May repeat in 2 hours if headache persists or recurs. Patient not taking: Reported on 08/24/2014 01/05/14   Carney Living, MD  tamsulosin (FLOMAX) 0.4 MG CAPS capsule Take 1 capsule (0.4 mg total) by mouth daily. 06/14/16   Quentin Angst, MD  traMADol (ULTRAM) 50 MG tablet Take 1 tablet (50 mg total) by mouth every 8 (eight) hours as needed. Patient not taking: Reported on 07/27/2015 08/24/14   Quentin Angst, MD  triamcinolone cream (KENALOG) 0.1 % Apply 1 application topically 2 (two) times daily. spanish Patient not taking: Reported on 07/27/2015 10/07/13   Doris CheadleAdvani, Deepak, MD   No results found.  Positive ROS: All other systems have been reviewed and were otherwise negative with the exception of those mentioned in the HPI and as above.  Physical Exam: Vitals: Refer to EMR. Constitutional:  WD, WN, NAD HEENT:  NCAT, EOMI Neuro/Psych:  Alert & oriented to person, place, and time; appropriate mood & affect Lymphatic: No generalized extremity edema or lymphadenopathy Extremities / MSK:  The extremities are normal with respect to appearance, ranges of motion,  joint stability, muscle strength/tone, sensation, & perfusion except as otherwise noted:  There are small transverse lacerations across the volar surfaces of the left long and index fingers.  The long finger is at the level of the midportion of T2, the index is slightly closer to the DIP flexion crease.  There is no significant redness or active drainage or bleeding.  The index finger laceration is well approximated, the long finger not as much as there is protruding subcutaneous tissues.  Sensation to light touch is intact on the radial and ulnar aspects of both digits.  FDS and FDP are intact, with the exception of the index finger where there is no discernible active DIP flexion.    Assessment: Left index and long finger lacerations, with likely left index FDP laceration   Plan: I discussed these findings with him.  I indicated that if he were to undergo reconstruction left index finger, he would require outpatient surgery, activity restrictions or modifications for about 3 months, and outpatient hand therapy.  I contrasted with him that if he chose not to reconstruct it, what he might expect with regard to posture, function, etc.  At the moment, he was noncommittal regarding surgical reconstruction.  I provided my office card to him, and indicated he should call in the next couple of days if he wishes to proceed with surgical reconstruction so appropriate planning can be made.    In the absence of the decision for surgical reconstruction, he may follow-up with his PCP or urgent care as needed for wound check, suture removal, etc.  Cliffton Astersavid A. Janee Mornhompson, MD      Orthopaedic & Hand Surgery [facility] [address] [place], [place]  [id number] Office: [phone] Mobile: [phone]  01/30/2017, 12:57 PM

## 2017-01-30 NOTE — ED Provider Notes (Signed)
[facility] Provider Note   CSN: [id number] Arrival date & time: 01/30/17  1324  By signing my name below, I, Sonum Patel, attest that this documentation has been prepared under the direction and in the presence of Korena Nass, PA-C.Marland Kitchen Electronically Signed: Leone Payor, Scribe. 01/30/17. 2:41 PM.  History   Chief Complaint Chief Complaint  Patient presents with  . Finger Injury    The history is provided by the patient. No language interpreter was used.     HPI Comments: Nathan Cherry is a 56 y.o. male who presents to the Emergency Department complaining of a laceration to the left fingers that occurred yesterday. Patient states the injury occurred at work on a piece of metal in [employer]. He notes associated difficulty with flexion of the left index finger. He was seen at [facility] earlier today and Dr. Mack Hook saw him in that clinic. He was advised to follow up with Dr. Janee Morn if he wanted to move forward with surgical reconstruction. While at the UC it was noted that patient was uncertain about any plan and therefore was sent to the ED for "further evaluation". There is no active bleeding at this time.   Past Medical History:  Diagnosis Date  . Allergy   . Anxiety    History of anger issues related to this.    Marland Kitchen BPH without obstruction/lower urinary tract symptoms    Has had in past--but has been asymptomatic in 2016  . External hemorrhoids   . Genital herpes   . Herpes   . Hyperlipidemia   . Hypertension   . IBS (irritable bowel syndrome)    Associated with anxiety  . Low testosterone   . PUD (peptic ulcer disease) 2014    Patient Active Problem List   Diagnosis Date Noted  . Genital herpes 07/27/2015  . Allergy 08/24/2014  . Pain in joint, shoulder region 08/24/2014  . Gingivitis, chronic 05/28/2014  . Neck muscle spasm 02/16/2014  . Abdominal pain, other specified site 02/16/2014  . Low testosterone 02/16/2014  . Migraine 01/05/2014  .  Anal pruritus 07/07/2013  . Generalized anxiety disorder 07/07/2013  . Allergic sinusitis 06/16/2013  . HTN (hypertension) 06/16/2013    History reviewed. No pertinent surgical history.     Home Medications    Prior to Admission medications   Medication Sig Start Date End Date Taking? Authorizing Provider  acyclovir (ZOVIRAX) 200 MG capsule 2 capsules by mouth twice daily 07/27/15   Julieanne Manson, MD  DEPO-TESTOSTERONE 200 MG/ML injection INJECT 1 ML (200 MG TOTAL) INTO THE MUSCLE EVERY 28 DAYS 06/14/16   Quentin Angst, MD  gemfibrozil (LOPID) 600 MG tablet TAKE ONE TABLET BY MOUTH TWICE DAILY WITH MEALS 07/18/16   Julieanne Manson, MD  metoprolol tartrate (LOPRESSOR) 25 MG tablet TAKE ONE TABLET BY MOUTH TWICE DAILY WITH MEALS 07/18/16   Julieanne Manson, MD  omeprazole (PRILOSEC) 20 MG capsule TAKE 1 CAPSULE BY MOUTH DAILY. TAKE 30 MINUTES BEFORE BREAKFAST 11/12/15   Quentin Angst, MD  sertraline (ZOLOFT) 50 MG tablet TAKE ONE-HALF TABLET BY MOUTH ONCE DAILY BEFORE MEAL(S) 07/18/16   Julieanne Manson, MD  tamsulosin (FLOMAX) 0.4 MG CAPS capsule Take 1 capsule (0.4 mg total) by mouth daily. 06/14/16   Quentin Angst, MD    Family History History reviewed. No pertinent family history.  Social History Social History  Substance Use Topics  . Smoking status: Never Smoker  . Smokeless tobacco: Never Used  . Alcohol use No  Comment: No alcohol since 2005.  History of drinking too much.     Allergies   Doxycycline   Review of Systems Review of Systems  Skin: Positive for wound.  Neurological: Positive for weakness.     Physical Exam Updated Vital Signs BP (!) 156/98 (BP Location: Right Arm)   Pulse 62   Temp 98.2 F (36.8 C) (Oral)   Resp 18   SpO2 99%   Physical Exam  Constitutional: He appears well-developed and well-nourished. No distress.  HENT:  Head: Normocephalic and atraumatic.  Eyes: Conjunctivae and EOM are normal. No  scleral icterus.  Neck: Normal range of motion.  Pulmonary/Chest: Effort normal. No respiratory distress.  Musculoskeletal:  Limited range of motion of the second and third digits of the left hand.  Neurological: He is alert.  Skin: No rash () noted. He is not diaphoretic.  Please see image below.  Psychiatric: He has a normal mood and affect.  Nursing note and vitals reviewed.      ED Treatments / Results  DIAGNOSTIC STUDIES: Oxygen Saturation is 99% on RA, normal by my interpretation.    COORDINATION OF CARE: 2:10 PM Discussed treatment plan with pt at bedside and pt agreed to plan.   Labs (all labs ordered are listed, but only abnormal results are displayed) Labs Reviewed - No data to display  EKG  EKG Interpretation None       Radiology Dg Hand Complete Left  Result Date: 01/30/2017 CLINICAL DATA:  Laceration at work with metal.  Initial encounter. EXAM: LEFT HAND - COMPLETE 3+ VIEW COMPARISON:  None. FINDINGS: Laceration seen along the second and third middle phalanges. No fracture or opaque foreign body. No dislocation. IMPRESSION: Negative for fracture or opaque foreign body. Electronically Signed   By: Marnee Spring M.D.   On: 01/30/2017 15:03    Procedures Procedures (including critical care time)  Medications Ordered in ED Medications - No data to display   Initial Impression / Assessment and Plan / ED Course  I have reviewed the triage vital signs and the nursing notes.  Pertinent labs & imaging results that were available during my care of the patient were reviewed by me and considered in my medical decision making (see chart for details).     Patient presents from urgent care for evaluation of finger laceration that occurred yesterday. Patient was seen by Dr. Janee Morn and was told to follow-up with him in clinic for further evaluation and discussing the possibility of surgery. Patient declined any definitive treatment when speaking to Dr. Janee Morn.  Please see his note for further detail. Patient was sent here from urgent care for "further evaluation." X-ray was obtained and showed no fracture, dislocation or foreign body present. Will not suture these lacerations as it has been greater than 24 hours since they occured. Patient is up-to-date on tetanus. I advised him to follow up with Dr. Janee Morn in his clinic if he decides anything about his further course of treatment. Continue ibuprofen or Tylenol as needed for pain. Area was dressed with antibiotic ointment. Return precautions given.  Final Clinical Impressions(s) / ED Diagnoses   Final diagnoses:  Laceration of left middle finger without foreign body without damage to nail, initial encounter    New Prescriptions Discharge Medication List as of 01/30/2017  3:14 PM     I personally performed the services described in this documentation, which was scribed in my presence. The recorded information has been reviewed and is accurate.    Idelle Leech,  Hillary BowHina, PA-C 01/30/17 1730    Jacalyn LefevreHaviland, Julie, MD 01/30/17 [phone]

## 2017-01-30 NOTE — ED Triage Notes (Signed)
[facility] a complaint of a laceration to his index finger on his left hand that occurred yesterday with a metal ladder.

## 2017-01-30 NOTE — ED Notes (Signed)
Pt was seen by Dr. Janee Mornhompson in the clinic. Instructed to place dressing on pt fingers and follow up in his office his as needed.

## 2017-01-30 NOTE — Telephone Encounter (Addendum)
Late Entry 11:06 am: [facility]. Triage assessed to  2nd and 3rd digits on left hand. Pt states injury occurred on yesterday around 1630. Presents with deep lacerations, bleeding is controlled, home dressing dry and intact. His fingers are betadine stained. He states he clean wounds this morning.   Recommendation: pt to present to urgent care or ED for further evaluation: XR and to address concerns with ligament or tendon injury. Pt unable to bend index finger without assistance.  He states last Tdap was 4 years ago.  Pt verbalized understanding.

## 2017-01-30 NOTE — Discharge Instructions (Signed)
Schedule appointment with hand specialist for further evaluation as soon as possible. Take ibuprofen or Tylenol as needed for pain. Return to ED for worsening pain, additional injury, numbness, weakness, fevers.

## 2017-01-30 NOTE — ED Triage Notes (Signed)
Pt reports he cut his left index and middle finger on a piece of metal at work. Bleeding controlled. Decreased ROM noted in the fingers. Sensation intact.

## 2017-01-30 NOTE — Discharge Instructions (Addendum)
I am sending the patient to the emergency room because I cannot repair the laceration and I feel that further attention is warranted.

## 2017-01-30 NOTE — ED Provider Notes (Addendum)
[facility]    CSN: [id number] Arrival date & time: 01/30/17  1113     History   Chief Complaint Chief Complaint  Patient presents with  . Laceration    HPI Nathan Cherry is a 56 y.o. male.   The patient presented to the [facility] with a complaint of a laceration to his index finger on his left hand that occurred yesterday with a metal ladder. He is unable to flex his index finger.  He's also lacerated the volar middle phalanx of his middle finger.  Last tetanus shot was 4 years ago.       Past Medical History:  Diagnosis Date  . Allergy   . Anxiety    History of anger issues related to this.    Marland Kitchen BPH without obstruction/lower urinary tract symptoms    Has had in past--but has been asymptomatic in 2016  . External hemorrhoids   . Genital herpes   . Herpes   . Hyperlipidemia   . Hypertension   . IBS (irritable bowel syndrome)    Associated with anxiety  . Low testosterone   . PUD (peptic ulcer disease) 2014    Patient Active Problem List   Diagnosis Date Noted  . Genital herpes 07/27/2015  . Allergy 08/24/2014  . Pain in joint, shoulder region 08/24/2014  . Gingivitis, chronic 05/28/2014  . Neck muscle spasm 02/16/2014  . Abdominal pain, other specified site 02/16/2014  . Low testosterone 02/16/2014  . Migraine 01/05/2014  . Anal pruritus 07/07/2013  . Generalized anxiety disorder 07/07/2013  . Allergic sinusitis 06/16/2013  . HTN (hypertension) 06/16/2013    History reviewed. No pertinent surgical history.     Home Medications    Prior to Admission medications   Medication Sig Start Date End Date Taking? Authorizing Provider  acyclovir (ZOVIRAX) 200 MG capsule 2 capsules by mouth twice daily 07/27/15   Julieanne Manson, MD  DEPO-TESTOSTERONE 200 MG/ML injection INJECT 1 ML (200 MG TOTAL) INTO THE MUSCLE EVERY 28 DAYS 06/14/16   Quentin Angst, MD  gemfibrozil (LOPID) 600 MG tablet TAKE ONE TABLET BY MOUTH TWICE DAILY WITH  MEALS 07/18/16   Julieanne Manson, MD  metoprolol tartrate (LOPRESSOR) 25 MG tablet TAKE ONE TABLET BY MOUTH TWICE DAILY WITH MEALS 07/18/16   Julieanne Manson, MD  omeprazole (PRILOSEC) 20 MG capsule TAKE 1 CAPSULE BY MOUTH DAILY. TAKE 30 MINUTES BEFORE BREAKFAST 11/12/15   Quentin Angst, MD  sertraline (ZOLOFT) 50 MG tablet TAKE ONE-HALF TABLET BY MOUTH ONCE DAILY BEFORE MEAL(S) 07/18/16   Julieanne Manson, MD  tamsulosin (FLOMAX) 0.4 MG CAPS capsule Take 1 capsule (0.4 mg total) by mouth daily. 06/14/16   Quentin Angst, MD    Family History History reviewed. No pertinent family history.  Social History Social History  Substance Use Topics  . Smoking status: Never Smoker  . Smokeless tobacco: Never Used  . Alcohol use No     Comment: No alcohol since 2005.  History of drinking too much.     Allergies   Doxycycline   Review of Systems Review of Systems  Musculoskeletal: Positive for joint swelling.  All other systems reviewed and are negative.    Physical Exam Triage Vital Signs ED Triage Vitals  Enc Vitals Group     BP 01/30/17 1127 126/82     Pulse Rate 01/30/17 1127 69     Resp 01/30/17 1127 18     Temp 01/30/17 1127 98.3 F (36.8 C)  Temp Source 01/30/17 1127 Oral     SpO2 01/30/17 1127 96 %     Weight --      Height --      Head Circumference --      Peak Flow --      Pain Score 01/30/17 1126 4     Pain Loc --      Pain Edu? --      Excl. in GC? --    No data found.   Updated Vital Signs BP 126/82 (BP Location: Right Arm)   Pulse 69   Temp 98.3 F (36.8 C) (Oral)   Resp 18   SpO2 96%   Physical Exam  Constitutional: He is oriented to person, place, and time. He appears well-developed and well-nourished.  HENT:  Mouth/Throat: Oropharynx is clear and moist.  Eyes: Conjunctivae and EOM are normal. Pupils are equal, round, and reactive to light.  Neck: Normal range of motion. Neck supple.  Pulmonary/Chest: Effort normal.    Musculoskeletal: He exhibits no deformity.  Patient is unable to flex his left index finger.  There is a 1 cm laceration on the volar aspect of both his index finger and middle finger on left side. There is mild swelling but no active bleeding.  There is pulp exuding from the volar laceration on the middle finger.  Vascular and neurological aspects of the laceration are intact.  Neurological: He is alert and oriented to person, place, and time. No sensory deficit.  Skin: Skin is warm.  Nursing note and vitals reviewed.   Dr. Janee Mornhompson came and saw the patient. He told the staff that the patient could follow up in his office "if he wants tomorrow."  The patient was uncertain about any plan and would like something done today if possible UC Treatments / Results  Labs (all labs ordered are listed, but only abnormal results are displayed) Labs Reviewed - No data to display  EKG  EKG Interpretation None       Radiology No results found.  Procedures Procedures (including critical care time)  Medications Ordered in UC Medications - No data to display   Initial Impression / Assessment and Plan / UC Course  I have reviewed the triage vital signs and the nursing notes.  Pertinent labs & imaging results that were available during my care of the patient were reviewed by me and considered in my medical decision making (see chart for details).   I am sending the patient to the emergency room for further evaluation.   Final Clinical Impressions(s) / UC Diagnoses   Final diagnoses:  Laceration of left index finger without foreign body, nail damage status unspecified, initial encounter  Laceration of left middle finger without foreign body, nail damage status unspecified, initial encounter    New Prescriptions Current Discharge Medication List       Elvina SidleLauenstein, Kailo Kosik, MD 01/30/17 1246    Elvina SidleLauenstein, Audrina Marten, MD 01/30/17 1313

## 2017-03-04 ENCOUNTER — Other Ambulatory Visit: Payer: Self-pay | Admitting: Internal Medicine

## 2017-03-04 DIAGNOSIS — A6000 Herpesviral infection of urogenital system, unspecified: Secondary | ICD-10-CM

## 2017-08-06 IMAGING — DX DG HAND COMPLETE 3+V*L*
4 series · 4 of 4 positions shown · non-contrast
Comparison: None.

CLINICAL DATA: Laceration at work with metal.  Initial encounter.

EXAM:
LEFT HAND - COMPLETE 3+ VIEW

[hand pa]
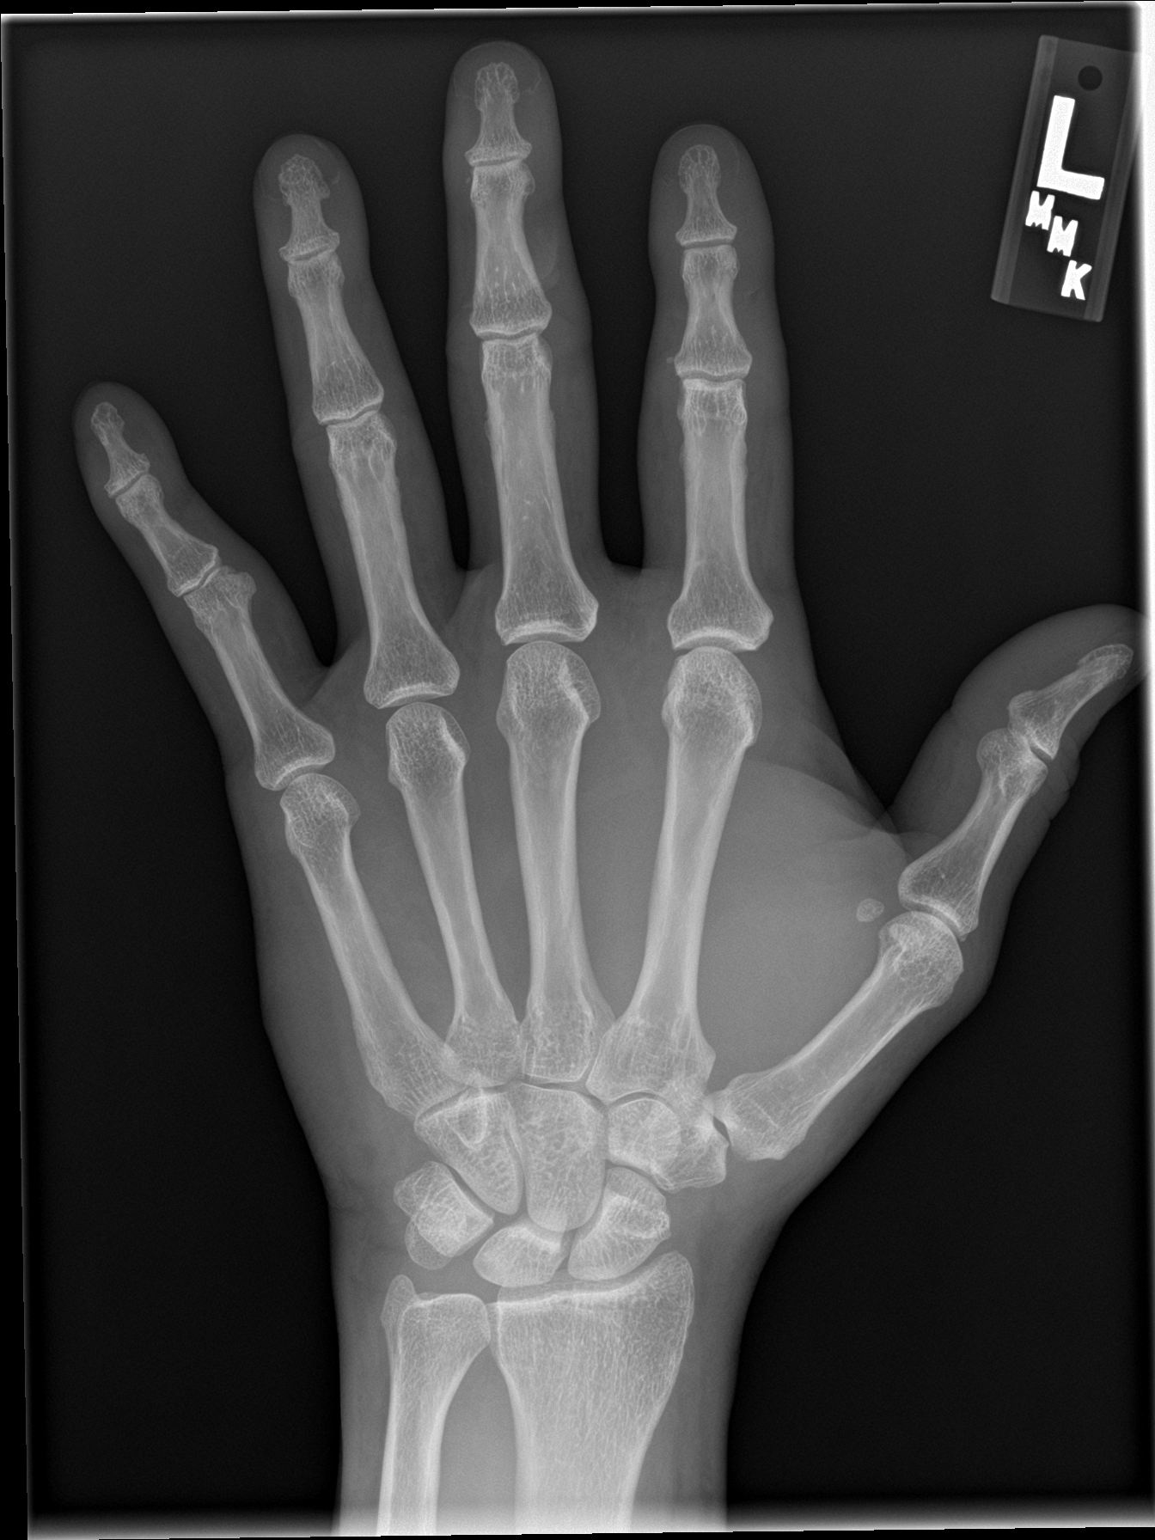

[hand obl]
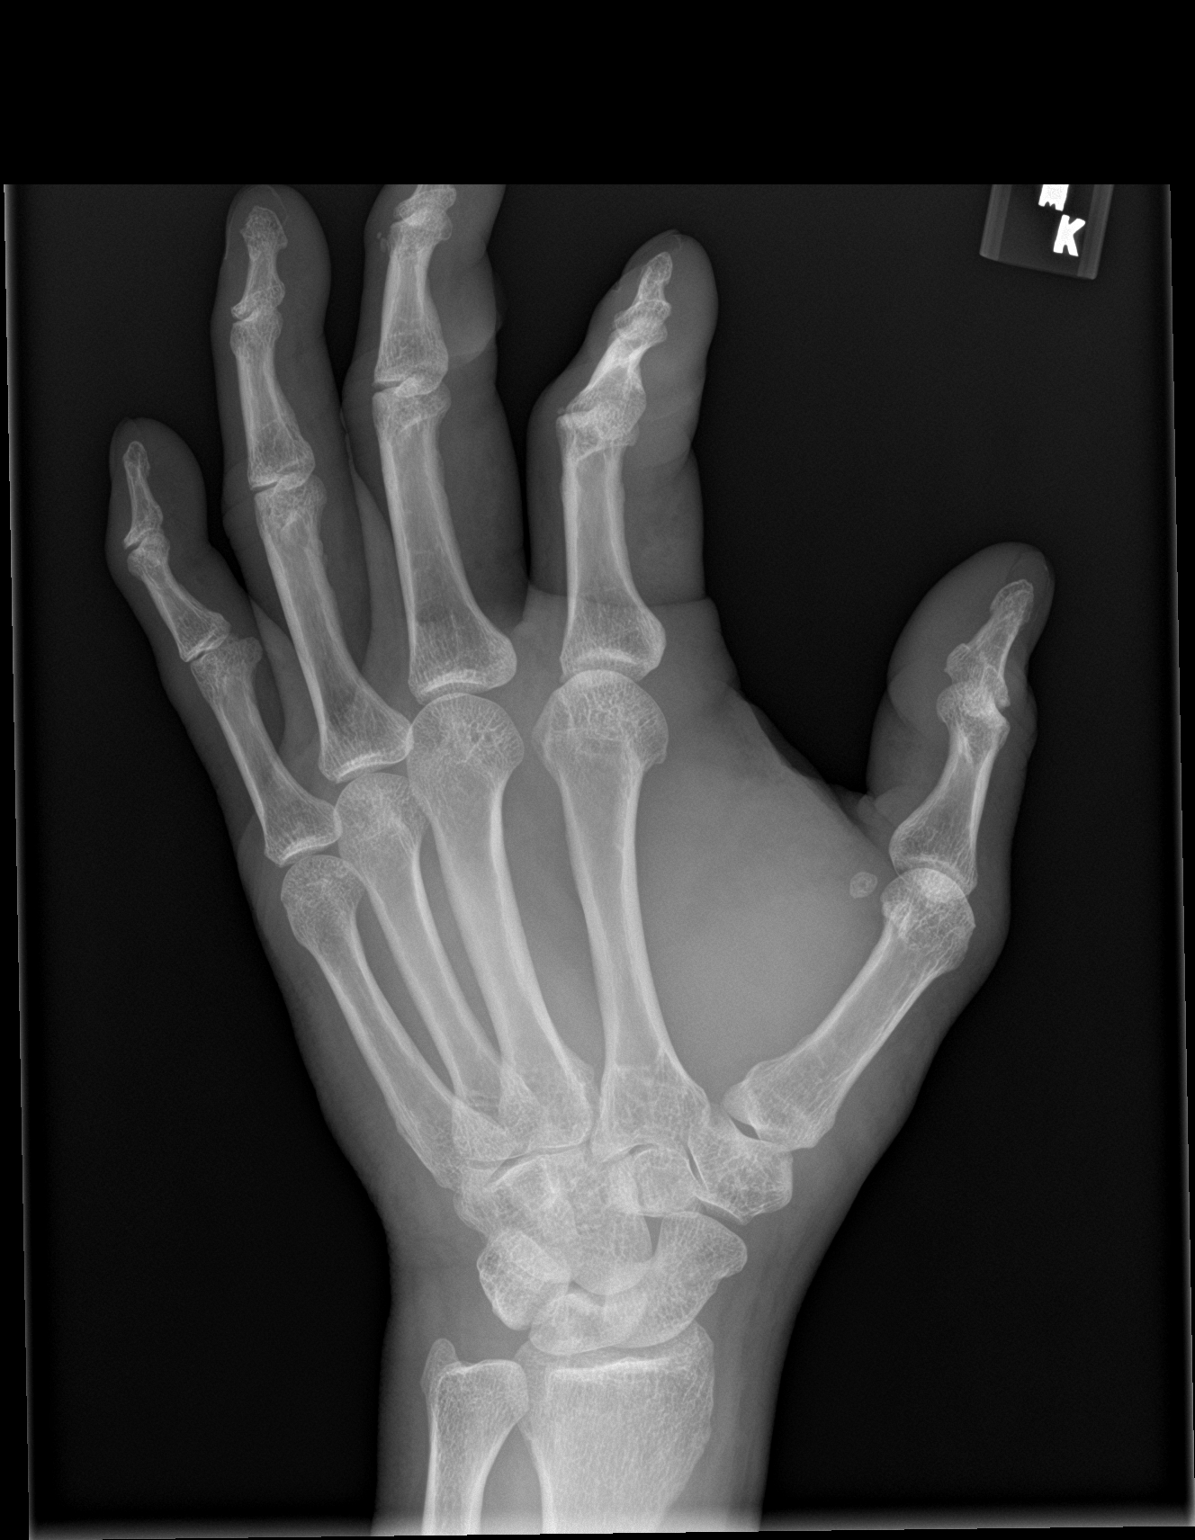

[hand lat (1 of 2)]
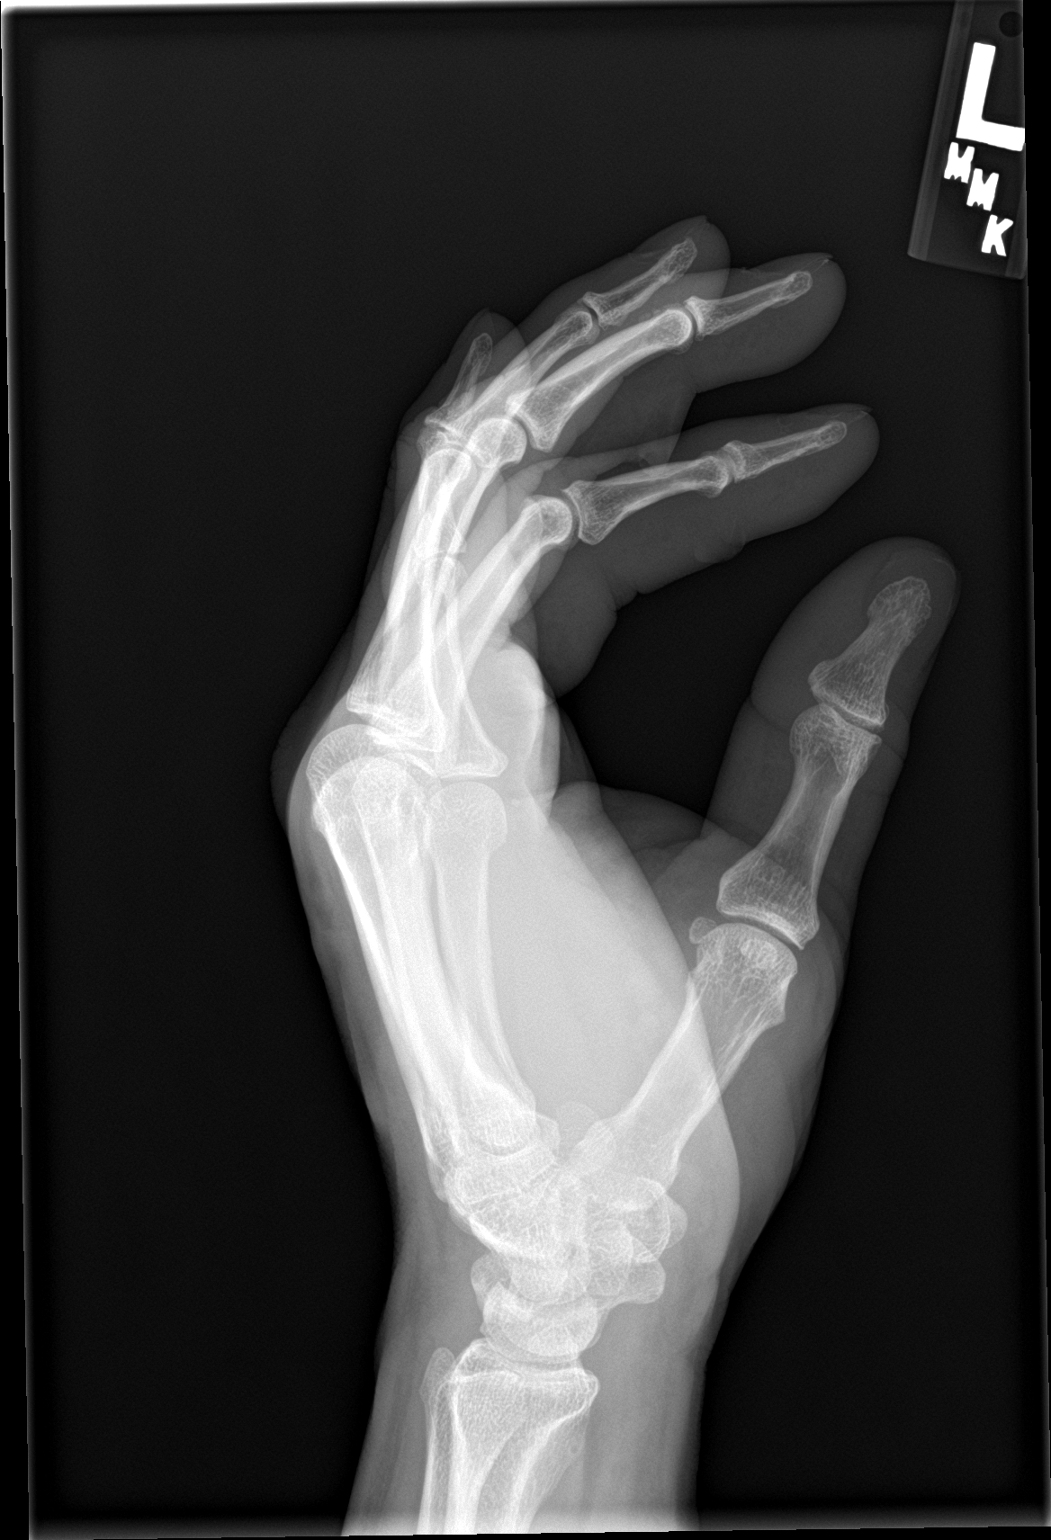

[hand lat (2 of 2)]
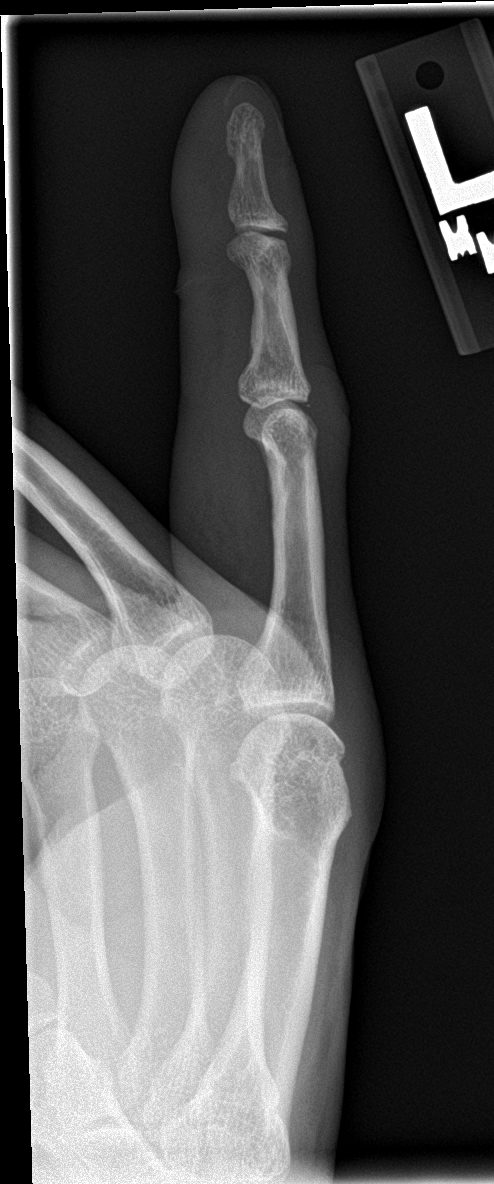

[4 of 4 positions shown; findings below may reference images not displayed]

FINDINGS: Laceration seen along the second and third middle phalanges. No
fracture or opaque foreign body. No dislocation.
IMPRESSION: Negative for fracture or opaque foreign body.

## 2018-06-18 ENCOUNTER — Encounter (HOSPITAL_COMMUNITY): Payer: Self-pay | Admitting: Emergency Medicine

## 2018-06-18 ENCOUNTER — Other Ambulatory Visit: Payer: Self-pay

## 2018-06-18 ENCOUNTER — Emergency Department (HOSPITAL_COMMUNITY)
Admission: EM | Admit: 2018-06-18 | Discharge: 2018-06-18 | Disposition: A | Discharge status: Home / Self Care | Payer: Self-pay | Attending: Emergency Medicine | Admitting: Emergency Medicine

## 2018-06-18 DIAGNOSIS — Z79899 Other long term (current) drug therapy: Secondary | ICD-10-CM | POA: Insufficient documentation

## 2018-06-18 DIAGNOSIS — I10 Essential (primary) hypertension: Secondary | ICD-10-CM | POA: Insufficient documentation

## 2018-06-18 DIAGNOSIS — K6289 Other specified diseases of anus and rectum: Secondary | ICD-10-CM | POA: Insufficient documentation

## 2018-06-18 LAB — URINALYSIS, ROUTINE W REFLEX MICROSCOPIC
Bilirubin Urine: NEGATIVE
GLUCOSE, UA: 50 mg/dL — AB
HGB URINE DIPSTICK: NEGATIVE
Ketones, ur: NEGATIVE mg/dL
Leukocytes, UA: NEGATIVE
Nitrite: NEGATIVE
PROTEIN: NEGATIVE mg/dL
Specific Gravity, Urine: 1.012 (ref 1.005–1.030)
pH: 7 (ref 5.0–8.0)

## 2018-06-18 MED ORDER — DICYCLOMINE HCL 20 MG PO TABS: 20.0000 mg | ORAL_TABLET | Freq: Three times a day (TID) | ORAL | 0 refills | Status: DC

## 2018-06-18 MED ORDER — SODIUM CHLORIDE 0.9 % IV BOLUS
1000.0000 mL | Freq: Once | INTRAVENOUS | Status: AC
  Administered 2018-06-18: 1000 mL via INTRAVENOUS

## 2018-06-18 MED ORDER — LOPERAMIDE HCL 2 MG PO CAPS
4.0000 mg | ORAL_CAPSULE | Freq: Once | ORAL | Status: AC
  Administered 2018-06-18: 4 mg via ORAL
  Filled 2018-06-18: qty 2

## 2018-06-18 NOTE — ED Notes (Signed)
Bed: WA20 Expected date:  Expected time:  Means of arrival:  Comments: EMS-rash

## 2018-06-18 NOTE — ED Triage Notes (Addendum)
Patient c/o rectal pain x 2 days. Was able to ambulate to ambulance, but needed a wheelchair into the hospital. Has hx of hemorrhoids and herpes. BP156/94 98% HR90

## 2018-06-18 NOTE — ED Provider Notes (Signed)
[facility]-EMERGENCY DEPT Provider Note  CSN: [id number] Arrival date & time: 06/18/18  7829    History   Chief Complaint Chief Complaint  Patient presents with  . Rectal Pain   HPI Nathan Cherry is a 57 y.o. male with a medical history of BPH, HTN, IBS, hemorrhoids and PUD who presented to the ED for rectal pain x2 days. He describes dull, aching pain. Denies pruritus or rectal masses/pressure.Pain is worse during a bowel movement, but quickly relieves after. Pain improved with OTC hemorrhoid cream and suppositories. Denies fever, chills, rectal bleeding, N/V, constipation. Endorses suprapubic "irritation" and diarrhea. Denies GU complaints of penile/testicular pain, dysuria, hematuria, urgency, frequency.Patient reports being treated for hemorrhoids in the past and states that this feels similar to that. He has tried OTC suppositories and cream with relief.  Past Medical History:  Diagnosis Date  . Allergy   . Anxiety    History of anger issues related to this.    Marland Kitchen BPH without obstruction/lower urinary tract symptoms    Has had in past--but has been asymptomatic in 2016  . External hemorrhoids   . Genital herpes   . Herpes   . Hyperlipidemia   . Hypertension   . IBS (irritable bowel syndrome)    Associated with anxiety  . Low testosterone   . PUD (peptic ulcer disease) 2014    Patient Active Problem List   Diagnosis Date Noted  . Genital herpes 07/27/2015  . Allergy 08/24/2014  . Pain in joint, shoulder region 08/24/2014  . Gingivitis, chronic 05/28/2014  . Neck muscle spasm 02/16/2014  . Abdominal pain, other specified site 02/16/2014  . Low testosterone 02/16/2014  . Migraine 01/05/2014  . Anal pruritus 07/07/2013  . Generalized anxiety disorder 07/07/2013  . Allergic sinusitis 06/16/2013  . HTN (hypertension) 06/16/2013    History reviewed. No pertinent surgical history.      Home Medications    Prior to Admission medications    Medication Sig Start Date End Date Taking? Authorizing Provider  acyclovir (ZOVIRAX) 200 MG capsule 2 capsules by mouth twice daily Patient taking differently: Take 200 mg by mouth 2 (two) times daily as needed (outbreaks).  07/27/15  Yes Julieanne Manson, MD  amLODipine (NORVASC) 10 MG tablet Take 10 mg by mouth every evening.   Yes [provider]  atenolol (TENORMIN) 100 MG tablet Take 100 mg by mouth daily.   Yes [provider]  Cyanocobalamin (VITAMIN B-12 PO) Take 1 capsule by mouth daily.   Yes [provider]  omeprazole (PRILOSEC) 20 MG capsule TAKE 1 CAPSULE BY MOUTH DAILY. TAKE 30 MINUTES BEFORE BREAKFAST Patient taking differently: Take 20 mg by mouth daily as needed (heartburn and indigestion). TAKE 30 MINUTES BEFORE BREAKFAST 11/12/15  Yes Jegede, Olugbemiga E, MD  DEPO-TESTOSTERONE 200 MG/ML injection INJECT 1 ML (200 MG TOTAL) INTO THE MUSCLE EVERY 28 DAYS Patient not taking: Reported on 06/18/2018 06/14/16   Quentin Angst, MD  gemfibrozil (LOPID) 600 MG tablet TAKE ONE TABLET BY MOUTH TWICE DAILY WITH MEALS Patient not taking: Reported on 06/18/2018 07/18/16   Julieanne Manson, MD  metoprolol tartrate (LOPRESSOR) 25 MG tablet TAKE ONE TABLET BY MOUTH TWICE DAILY WITH MEALS Patient not taking: Reported on 06/18/2018 07/18/16   Julieanne Manson, MD  sertraline (ZOLOFT) 50 MG tablet TAKE ONE-HALF TABLET BY MOUTH ONCE DAILY BEFORE MEAL(S) Patient not taking: Reported on 06/18/2018 07/18/16   Julieanne Manson, MD  tamsulosin (FLOMAX) 0.4 MG CAPS capsule Take 1 capsule (  0.4 mg total) by mouth daily. Patient not taking: Reported on 06/18/2018 06/14/16   Quentin Angst, MD  Cetirizine HCl (ZYRTEC ALLERGY) 10 MG CAPS Take 1 capsule (10 mg total) by mouth daily at 8 pm. 06/16/13 09/23/13  Leroy Sea, MD  ipratropium (ATROVENT) 0.06 % nasal spray Place 2 sprays into both nostrils 4 (four) times daily. 08/12/13 09/23/13  Rodolph Bong, MD     Family History History reviewed. No pertinent family history.  Social History Social History   Tobacco Use  . Smoking status: Never Smoker  . Smokeless tobacco: Never Used  Substance Use Topics  . Alcohol use: No    Alcohol/week: 0.0 standard drinks    Comment: No alcohol since 2005.  History of drinking too much.  . Drug use: No     Allergies   Doxycycline   Review of Systems Review of Systems  Constitutional: Negative for chills, fever and unexpected weight change.  Gastrointestinal: Positive for abdominal pain, diarrhea and rectal pain. Negative for blood in stool, constipation, nausea and vomiting.  Genitourinary: Negative.   Musculoskeletal: Negative.   Skin: Negative.      Physical Exam Updated Vital Signs BP (!) 170/92   Pulse 91   Temp 98.7 F (37.1 C) (Oral)   Resp 16   SpO2 99%   Physical Exam  Constitutional: He appears well-developed and well-nourished.  Abdominal: Soft. Normal appearance and bowel sounds are normal. He exhibits no distension. There is tenderness in the suprapubic area. There is no rigidity and no guarding.  Genitourinary: Rectum normal, testes normal and penis normal. Rectal exam shows no external hemorrhoid, no internal hemorrhoid, no tenderness and anal tone normal. Prostate is enlarged. Prostate is not tender. Uncircumcised.  Genitourinary Comments: No rectal bleeding. Normal rectal tone. No anal fissures, cysts, abscesses or masses.  Lymphadenopathy: No inguinal adenopathy noted on the right or left side.  Skin: Skin is warm and intact. Capillary refill takes less than 2 seconds. No rash noted.  Nursing note and vitals reviewed.  ED Treatments / Results  Labs (all labs ordered are listed, but only abnormal results are displayed) Labs Reviewed  URINALYSIS, ROUTINE W REFLEX MICROSCOPIC - Abnormal; Notable for the following components:      Result Value   Glucose, UA 50 (*)    All other components within normal limits     EKG None  Radiology No results found.  Procedures Procedures (including critical care time)  Medications Ordered in ED Medications  loperamide (IMODIUM) capsule 4 mg (4 mg Oral Given 06/18/18 1043)  sodium chloride 0.9 % bolus 1,000 mL (0 mLs Intravenous Stopped 06/18/18 1122)     Initial Impression / Assessment and Plan / ED Course  Triage vital signs and the nursing notes have been reviewed.  Pertinent labs & imaging results that were available during care of the patient were reviewed and considered in medical decision making (see chart for details).  Patient presents to the ED with rectal pain and is concerned for hemorrhoids. He states that this pain is relieved with OTC hemorrhoid preparations that he has tried. On exam, there are no visible or palpable internal or external hemorrhoids or other abnormalities in the anorectal area that could be contributing to complaint. He has no rectal bleeding or s/s that would suggest an acute blood loss from a GI source that warrants further evaluation. Possible internal hemorrhoids present that cannot be appreciated on exam. Patient already established with PCP for this issue and is  stable today to follow-up there.  Patient also complains of suprapubic "irritation," but denies other urinary complaints. No abdominal tenderness or other physical exam findings to suggest an acute intra-abdominal process or the need for abdominal imaging. Will obtain UA to evaluate for UTI. Clinical Course as of Jun 19 1151  Tue Jun 18, 2018  1107 UA not suggestive of UTI.   [GM]    Clinical Course User Index [GM] Windy Carina, [place]    Final Clinical Impressions(s) / ED Diagnoses  1. Rectal Pain. Possible internal hemorrhoid. Education provided on OTC and supportive treatment for relief. Advised to follow-up with PCP regarding this and the need for referral to another specialist.  Dispo: Home. After thorough clinical evaluation, this patient is  determined to be medically stable and can be safely discharged with the previously mentioned treatment and/or outpatient follow-up/referral(s). At this time, there are no other apparent medical conditions that require further screening, evaluation or treatment.   Final diagnoses:  Rectal pain    ED Discharge Orders    None        Reva Bores 06/18/18 1213    Raeford Razor, MD 06/18/18 1224

## 2018-06-18 NOTE — Discharge Instructions (Addendum)
Your physical exam looked good today. I did not see or feel any hemorrhoids. If the suppositories and creams work, I recommend that you continue doing that. I have included some reading material for you on hemorrhoids that includes other treatment options that you can try.  It is very important that you follow-up with your PCP about this issue. He/she can refer you to a specialist if it is needed. For your testosterone, that also needs to be checked by your PCP.  For stomach acid/heartburn, you can buy any of the following from the pharmacy or [facility]: Prilosec, Zantac, Pepcid, Pepto Bismol or Tums. For stomach-specific pain, I have prescribed Bentyl and that has been sent to your pharmacy.  Thank you for allowing [place] to take care of you today.

## 2020-02-11 ENCOUNTER — Other Ambulatory Visit: Payer: Self-pay

## 2020-02-11 ENCOUNTER — Encounter (HOSPITAL_COMMUNITY): Payer: Self-pay

## 2020-02-11 ENCOUNTER — Ambulatory Visit (HOSPITAL_COMMUNITY)
Admission: EM | Admit: 2020-02-11 | Discharge: 2020-02-11 | Disposition: A | Discharge status: Home / Self Care | Payer: Self-pay | Attending: Internal Medicine | Admitting: Internal Medicine

## 2020-02-11 DIAGNOSIS — R1013 Epigastric pain: Secondary | ICD-10-CM | POA: Insufficient documentation

## 2020-02-11 LAB — CBC WITH DIFFERENTIAL/PLATELET
Abs Immature Granulocytes: 0.03 10*3/uL (ref 0.00–0.07)
Basophils Absolute: 0.1 10*3/uL (ref 0.0–0.1)
Basophils Relative: 1 %
Eosinophils Absolute: 0.4 10*3/uL (ref 0.0–0.5)
Eosinophils Relative: 3 %
HCT: 53.9 % — ABNORMAL HIGH (ref 39.0–52.0)
Hemoglobin: 18.8 g/dL — ABNORMAL HIGH (ref 13.0–17.0)
Immature Granulocytes: 0 %
Lymphocytes Relative: 26 %
Lymphs Abs: 2.8 10*3/uL (ref 0.7–4.0)
MCH: 30.3 pg (ref 26.0–34.0)
MCHC: 34.9 g/dL (ref 30.0–36.0)
MCV: 86.9 fL (ref 80.0–100.0)
Monocytes Absolute: 0.9 10*3/uL (ref 0.1–1.0)
Monocytes Relative: 9 %
Neutro Abs: 6.4 10*3/uL (ref 1.7–7.7)
Neutrophils Relative %: 61 %
Platelets: 343 10*3/uL (ref 150–400)
RBC: 6.2 MIL/uL — ABNORMAL HIGH (ref 4.22–5.81)
RDW: 12.7 % (ref 11.5–15.5)
WBC: 10.6 10*3/uL — ABNORMAL HIGH (ref 4.0–10.5)
nRBC: 0 % (ref 0.0–0.2)

## 2020-02-11 LAB — CBG MONITORING, ED: Glucose-Capillary: 103 mg/dL — ABNORMAL HIGH (ref 70–99)

## 2020-02-11 LAB — COMPREHENSIVE METABOLIC PANEL
ALT: 52 U/L — ABNORMAL HIGH (ref 0–44)
AST: 32 U/L (ref 15–41)
Albumin: 4.5 g/dL (ref 3.5–5.0)
Alkaline Phosphatase: 68 U/L (ref 38–126)
Anion gap: 11 (ref 5–15)
BUN: 9 mg/dL (ref 8–23)
CO2: 28 mmol/L (ref 22–32)
Calcium: 9.4 mg/dL (ref 8.9–10.3)
Chloride: 98 mmol/L (ref 98–111)
Creatinine, Ser: 0.94 mg/dL (ref 0.61–1.24)
GFR calc Af Amer: >60 mL/min (ref >60)
GFR calc non Af Amer: >60 mL/min (ref >60)
Glucose, Bld: 122 mg/dL — ABNORMAL HIGH (ref 70–99)
Potassium: 4.5 mmol/L (ref 3.5–5.1)
Sodium: 137 mmol/L (ref 135–145)
Total Bilirubin: 1.3 mg/dL — ABNORMAL HIGH (ref 0.3–1.2)
Total Protein: 7.9 g/dL (ref 6.5–8.1)

## 2020-02-11 LAB — TSH: TSH: 3.101 u[IU]/mL (ref 0.350–4.500)

## 2020-02-11 MED ORDER — PANTOPRAZOLE SODIUM 20 MG PO TBEC: 20.0000 mg | DELAYED_RELEASE_TABLET | Freq: Two times a day (BID) | ORAL | 1 refills | Status: DC

## 2020-02-11 MED ORDER — HYDROCORTISONE (PERIANAL) 2.5 % EX CREA: 1.0000 "application " | TOPICAL_CREAM | Freq: Two times a day (BID) | CUTANEOUS | 0 refills | Status: DC

## 2020-02-11 NOTE — ED Triage Notes (Signed)
Pt presents with generalized abdominal discomfort and bloating X 3 days.

## 2020-02-12 NOTE — ED Provider Notes (Signed)
[facility]    CSN: [id number] Arrival date & time: 02/11/20  1406      History   Chief Complaint Chief Complaint  Patient presents with  . Abdominal Pain    HPI Nathan Cherry is a 59 y.o. male comes to the urgent care with abdominal pain of 3 days duration. Patient says the pain is mild to moderate in severity. Not aggravated by food or hunger. No vomiting or diarrhea. It is associated with abdominal bloating. No change in bowel movements. No weight loss or weight gain. No fatigue or dizziness.Patient has a history peptic ulcer disease. Also has a history of hemorrhoids .   HPI  Past Medical History:  Diagnosis Date  . Allergy   . Anxiety    History of anger issues related to this.    Marland Kitchen BPH without obstruction/lower urinary tract symptoms    Has had in past--but has been asymptomatic in 2016  . External hemorrhoids   . Genital herpes   . Herpes   . Hyperlipidemia   . Hypertension   . IBS (irritable bowel syndrome)    Associated with anxiety  . Low testosterone   . PUD (peptic ulcer disease) 2014    Patient Active Problem List   Diagnosis Date Noted  . Genital herpes 07/27/2015  . Allergy 08/24/2014  . Pain in joint, shoulder region 08/24/2014  . Gingivitis, chronic 05/28/2014  . Neck muscle spasm 02/16/2014  . Abdominal pain, other specified site 02/16/2014  . Low testosterone 02/16/2014  . Migraine 01/05/2014  . Anal pruritus 07/07/2013  . Generalized anxiety disorder 07/07/2013  . Allergic sinusitis 06/16/2013  . HTN (hypertension) 06/16/2013    History reviewed. No pertinent surgical history.     Home Medications    Prior to Admission medications   Medication Sig Start Date End Date Taking? Authorizing Provider  acyclovir (ZOVIRAX) 200 MG capsule 2 capsules by mouth twice daily Patient taking differently: Take 200 mg by mouth 2 (two) times daily as needed (outbreaks).  07/27/15   Mack Hook, MD  amLODipine (NORVASC) 10  MG tablet Take 10 mg by mouth every evening.    [provider]  atenolol (TENORMIN) 100 MG tablet Take 100 mg by mouth daily.    [provider]  Cyanocobalamin (VITAMIN B-12 PO) Take 1 capsule by mouth daily.    [provider]  dicyclomine (BENTYL) 20 MG tablet Take 1 tablet (20 mg total) by mouth 4 (four) times daily -  before meals and at bedtime for 10 days. 06/18/18 06/28/18  Mortis, Jonelle Sports, PA-C  hydrocortisone (ANUSOL-HC) 2.5 % rectal cream Place 1 application rectally 2 (two) times daily. 02/11/20   Chase Picket, MD  omeprazole (PRILOSEC) 20 MG capsule TAKE 1 CAPSULE BY MOUTH DAILY. TAKE 30 MINUTES BEFORE BREAKFAST Patient taking differently: Take 20 mg by mouth daily as needed (heartburn and indigestion). TAKE 30 MINUTES BEFORE BREAKFAST 11/12/15   Tresa Garter, MD  pantoprazole (PROTONIX) 20 MG tablet Take 1 tablet (20 mg total) by mouth 2 (two) times daily. 02/11/20 03/12/20  Chase Picket, MD  DEPO-TESTOSTERONE 200 MG/ML injection INJECT 1 ML (200 MG TOTAL) INTO THE MUSCLE EVERY 28 DAYS Patient not taking: Reported on 06/18/2018 06/14/16 02/11/20  Tresa Garter, MD  gemfibrozil (LOPID) 600 MG tablet TAKE ONE TABLET BY MOUTH TWICE DAILY WITH MEALS Patient not taking: Reported on 06/18/2018 07/18/16 02/11/20  Mack Hook, MD  metoprolol tartrate (LOPRESSOR) 25 MG tablet TAKE ONE TABLET  BY MOUTH TWICE DAILY WITH MEALS Patient not taking: Reported on 06/18/2018 07/18/16 02/11/20  Julieanne Manson, MD  sertraline (ZOLOFT) 50 MG tablet TAKE ONE-HALF TABLET BY MOUTH ONCE DAILY BEFORE MEAL(S) Patient not taking: Reported on 06/18/2018 07/18/16 02/11/20  Julieanne Manson, MD    Family History History reviewed. No pertinent family history.  Social History Social History   Tobacco Use  . Smoking status: Never Smoker  . Smokeless tobacco: Never Used  Substance Use Topics  . Alcohol use: No    Alcohol/week: 0.0 standard drinks    Comment: No  alcohol since 2005.  History of drinking too much.  . Drug use: No     Allergies   Doxycycline   Review of Systems Review of Systems  Constitutional: Negative for activity change, fatigue and fever.  HENT: Negative.   Gastrointestinal: Positive for abdominal pain. Negative for diarrhea, nausea and vomiting.  Genitourinary: Negative.   Musculoskeletal: Negative for arthralgias and joint swelling.  Neurological: Negative.  Negative for dizziness, light-headedness and headaches.     Physical Exam Triage Vital Signs ED Triage Vitals  Enc Vitals Group     BP 02/11/20 1448 (!) 142/88     Pulse Rate 02/11/20 1448 60     Resp 02/11/20 1448 17     Temp 02/11/20 1448 99.6 F (37.6 C)     Temp Source 02/11/20 1448 Oral     SpO2 02/11/20 1448 98 %     Weight --      Height --      Head Circumference --      Peak Flow --      Pain Score 02/11/20 1447 4     Pain Loc --      Pain Edu? --      Excl. in GC? --    No data found.  Updated Vital Signs BP (!) 142/88 (BP Location: Left Arm)   Pulse 60   Temp 99.6 F (37.6 C) (Oral)   Resp 17   SpO2 98%   Visual Acuity Right Eye Distance:   Left Eye Distance:   Bilateral Distance:    Right Eye Near:   Left Eye Near:    Bilateral Near:     Physical Exam Vitals and nursing note reviewed.  Constitutional:      General: He is not in acute distress.    Appearance: He is not ill-appearing.  Cardiovascular:     Rate and Rhythm: Normal rate and regular rhythm.  Pulmonary:     Effort: Pulmonary effort is normal. No respiratory distress.     Breath sounds: Normal breath sounds. No wheezing or rhonchi.  Abdominal:     General: Abdomen is protuberant. Bowel sounds are normal. There is no distension or abdominal bruit.     Palpations: Abdomen is soft. There is no shifting dullness, fluid wave or splenomegaly.     Tenderness: There is no abdominal tenderness.     Hernia: No hernia is present.  Neurological:     General: No focal  deficit present.     Mental Status: He is alert.      UC Treatments / Results  Labs (all labs ordered are listed, but only abnormal results are displayed) Labs Reviewed  CBC WITH DIFFERENTIAL/PLATELET - Abnormal; Notable for the following components:      Result Value   WBC 10.6 (*)    RBC 6.20 (*)    Hemoglobin 18.8 (*)    HCT 53.9 (*)    All  other components within normal limits  COMPREHENSIVE METABOLIC PANEL - Abnormal; Notable for the following components:   Glucose, Bld 122 (*)    ALT 52 (*)    Total Bilirubin 1.3 (*)    All other components within normal limits  CBG MONITORING, ED - Abnormal; Notable for the following components:   Glucose-Capillary 103 (*)    All other components within normal limits  TSH    EKG   Radiology No results found.  Procedures Procedures (including critical care time)  Medications Ordered in UC Medications - No data to display  Initial Impression / Assessment and Plan / UC Course  I have reviewed the triage vital signs and the nursing notes.  Pertinent labs & imaging results that were available during my care of the patient were reviewed by me and considered in my medical decision making (see chart for details).     1. Abdominal pain: POC CBG CBC,BMP Start Protonix 20mg  orally daily Return precautions given. Return to urgent care if symptoms worsen Final Clinical Impressions(s) / UC Diagnoses   Final diagnoses:  Epigastric pain   Discharge Instructions   None    ED Prescriptions    Medication Sig Dispense Auth. Provider   pantoprazole (PROTONIX) 20 MG tablet Take 1 tablet (20 mg total) by mouth 2 (two) times daily. 60 tablet Melbourne Jakubiak, , MD   hydrocortisone (ANUSOL-HC) 2.5 % rectal cream Place 1 application rectally 2 (two) times daily. 30 g Giovanna Kemmerer, Britta Mccreedy, MD     PDMP not reviewed this encounter.   Britta Mccreedy, MD 02/12/20 [id number]

## 2022-10-20 ENCOUNTER — Ambulatory Visit (HOSPITAL_COMMUNITY)
Admission: EM | Admit: 2022-10-20 | Discharge: 2022-10-20 | Disposition: A | Discharge status: Home / Self Care | Payer: Self-pay | Attending: Family Medicine | Admitting: Family Medicine

## 2022-10-20 ENCOUNTER — Encounter (HOSPITAL_COMMUNITY): Payer: Self-pay

## 2022-10-20 DIAGNOSIS — R1030 Lower abdominal pain, unspecified: Secondary | ICD-10-CM

## 2022-10-20 MED ORDER — DICYCLOMINE HCL 20 MG PO TABS: 20.0000 mg | ORAL_TABLET | Freq: Four times a day (QID) | ORAL | 0 refills | Status: AC | PRN

## 2022-10-20 NOTE — Discharge Instructions (Signed)
Dicyclomine--take 1 every 6 hours as needed for intestinal cramps (1 cada 6 horas cuando Ud tenga colicos abdominales)  Please followup with your primary care doctor about this issue. (Por favor, hablese Ud a su doctor(a) general para una cita para evaluar este problema)

## 2022-10-20 NOTE — ED Provider Notes (Signed)
[place]    CSN: [id number] Arrival date & time: 10/20/22  1313      History   Chief Complaint Chief Complaint  Patient presents with   Abdominal Pain    HPI Nathan Cherry is a 62 y.o. male.    Abdominal Pain  Here for suprapubic abdominal pain that has been bothering him for the last 3 days.  It is a burning discomfort.  No cramps.  Occasionally he is constipated but not necessarily now.  He is not having any dysuria or hematuria.  No blood in the stool.  Occasionally his appetite is a little decreased, but he is not having any nausea or vomiting.   He does have primary care and states that he is taking Nexium already.  He denies feeling like he needs to burp.  Does take medication for hypertension and takes hydroxyzine to help him rest.  Past Medical History:  Diagnosis Date   Allergy    Anxiety    History of anger issues related to this.     BPH without obstruction/lower urinary tract symptoms    Has had in past--but has been asymptomatic in 2016   External hemorrhoids    Genital herpes    Herpes    Hyperlipidemia    Hypertension    IBS (irritable bowel syndrome)    Associated with anxiety   Low testosterone    PUD (peptic ulcer disease) 2014    Patient Active Problem List   Diagnosis Date Noted   Genital herpes 07/27/2015   Allergy 08/24/2014   Pain in joint, shoulder region 08/24/2014   Gingivitis, chronic 05/28/2014   Neck muscle spasm 02/16/2014   Abdominal pain, other specified site 02/16/2014   Low testosterone 02/16/2014   Migraine 01/05/2014   Anal pruritus 07/07/2013   Generalized anxiety disorder 07/07/2013   Allergic sinusitis 06/16/2013   HTN (hypertension) 06/16/2013    History reviewed. No pertinent surgical history.     Home Medications    Prior to Admission medications   Medication Sig Start Date End Date Taking? Authorizing Provider  dicyclomine (BENTYL) 20 MG tablet Take 1 tablet (20 mg total) by mouth 4  (four) times daily as needed (intestinal cramps). 10/20/22  Yes Madhuri Vacca, Gwenlyn Perking, MD  acyclovir (ZOVIRAX) 200 MG capsule 2 capsules by mouth twice daily Patient taking differently: Take 200 mg by mouth 2 (two) times daily as needed (outbreaks).  07/27/15   Mack Hook, MD  amLODipine (NORVASC) 10 MG tablet Take 10 mg by mouth every evening.    [provider]  atenolol (TENORMIN) 100 MG tablet Take 100 mg by mouth daily.    [provider]  Cyanocobalamin (VITAMIN B-12 PO) Take 1 capsule by mouth daily.    [provider]  omeprazole (PRILOSEC) 20 MG capsule TAKE 1 CAPSULE BY MOUTH DAILY. TAKE 30 MINUTES BEFORE BREAKFAST Patient taking differently: Take 20 mg by mouth daily as needed (heartburn and indigestion). TAKE 30 MINUTES BEFORE BREAKFAST 11/12/15   Tresa Garter, MD  pantoprazole (PROTONIX) 20 MG tablet Take 1 tablet (20 mg total) by mouth 2 (two) times daily. 02/11/20 03/12/20  Chase Picket, MD  DEPO-TESTOSTERONE 200 MG/ML injection INJECT 1 ML (200 MG TOTAL) INTO THE MUSCLE EVERY 28 DAYS Patient not taking: Reported on 06/18/2018 06/14/16 02/11/20  Tresa Garter, MD  gemfibrozil (LOPID) 600 MG tablet TAKE ONE TABLET BY MOUTH TWICE DAILY WITH MEALS Patient not taking: Reported on 06/18/2018 07/18/16 02/11/20  Mack Hook,  MD  metoprolol tartrate (LOPRESSOR) 25 MG tablet TAKE ONE TABLET BY MOUTH TWICE DAILY WITH MEALS Patient not taking: Reported on 06/18/2018 07/18/16 02/11/20  Mack Hook, MD  sertraline (ZOLOFT) 50 MG tablet TAKE ONE-HALF TABLET BY MOUTH ONCE DAILY BEFORE MEAL(S) Patient not taking: Reported on 06/18/2018 07/18/16 02/11/20  Mack Hook, MD    Family History History reviewed. No pertinent family history.  Social History Social History   Tobacco Use   Smoking status: Never   Smokeless tobacco: Never  Substance Use Topics   Alcohol use: No    Alcohol/week: 0.0 standard drinks of alcohol    Comment: No  alcohol since 2005.  History of drinking too much.   Drug use: No     Allergies   Doxycycline   Review of Systems Review of Systems  Gastrointestinal:  Positive for abdominal pain.     Physical Exam Triage Vital Signs ED Triage Vitals [10/20/22 1416]  Enc Vitals Group     BP (!) 159/81     Pulse Rate 72     Resp 18     Temp 97.9 F (36.6 C)     Temp Source Oral     SpO2 97 %     Weight      Height      Head Circumference      Peak Flow      Pain Score 0     Pain Loc      Pain Edu?      Excl. in [place]?    No data found.  Updated Vital Signs BP (!) 159/81 (BP Location: Right Arm)   Pulse 72   Temp 97.9 F (36.6 C) (Oral)   Resp 18   SpO2 97%   Visual Acuity Right Eye Distance:   Left Eye Distance:   Bilateral Distance:    Right Eye Near:   Left Eye Near:    Bilateral Near:     Physical Exam Vitals reviewed.  Constitutional:      General: He is not in acute distress.    Appearance: He is not toxic-appearing.  HENT:     Nose: Nose normal.     Mouth/Throat:     Mouth: Mucous membranes are moist.     Pharynx: No oropharyngeal exudate or posterior oropharyngeal erythema.  Eyes:     Extraocular Movements: Extraocular movements intact.     Conjunctiva/sclera: Conjunctivae normal.     Pupils: Pupils are equal, round, and reactive to light.  Cardiovascular:     Rate and Rhythm: Normal rate and regular rhythm.     Heart sounds: No murmur heard. Pulmonary:     Effort: No respiratory distress.     Breath sounds: No stridor. No wheezing, rhonchi or rales.  Abdominal:     General: There is no distension.     Palpations: Abdomen is soft. There is no mass.     Tenderness: There is no abdominal tenderness. There is no guarding.  Musculoskeletal:     Cervical back: Neck supple.  Lymphadenopathy:     Cervical: No cervical adenopathy.  Skin:    Capillary Refill: Capillary refill takes less than 2 seconds.     Coloration: Skin is not jaundiced or pale.   Neurological:     General: No focal deficit present.     Mental Status: He is alert and oriented to person, place, and time.  Psychiatric:        Behavior: Behavior normal.      UC  Treatments / Results  Labs (all labs ordered are listed, but only abnormal results are displayed) Labs Reviewed - No data to display  EKG   Radiology No results found.  Procedures Procedures (including critical care time)  Medications Ordered in UC Medications - No data to display  Initial Impression / Assessment and Plan / UC Course  I have reviewed the triage vital signs and the nursing notes.  Pertinent labs & imaging results that were available during my care of the patient were reviewed by me and considered in my medical decision making (see chart for details).        He shows me a bottle of dicyclomine and states that it often helps him a lot.  This problem has bothered him intermittently for some time.  The last time was several months ago.  Dicyclomine is sent in to help the symptoms.  I have asked him to follow-up with his primary in case he needs further evaluation. He states he has had colonoscopy already, and that he is already taking nexium per his MD. I think this is low likelihood for reflux/gastritis, with it being below his umbilicus Final Clinical Impressions(s) / UC Diagnoses   Final diagnoses:  Lower abdominal pain     Discharge Instructions      Dicyclomine--take 1 every 6 hours as needed for intestinal cramps (1 cada 6 horas cuando Ud tenga colicos abdominales)  Please followup with your primary care doctor about this issue. (Por favor, hablese Ud a su doctor(a) general para una cita para evaluar este problema)     ED Prescriptions     Medication Sig Dispense Auth. Provider   dicyclomine (BENTYL) 20 MG tablet Take 1 tablet (20 mg total) by mouth 4 (four) times daily as needed (intestinal cramps). 40 tablet Lavern Crimi, Gwenlyn Perking, MD      PDMP not reviewed  this encounter.   Barrett Henle, MD 10/20/22 [phone]

## 2022-10-20 NOTE — ED Triage Notes (Signed)
Pt c/o intermittent lower abdominal pain x3 days with bloating and tightness. Denies pain. Last NBM was this am. Denies n/v/d.

## 2022-11-25 ENCOUNTER — Emergency Department (HOSPITAL_COMMUNITY)
Admission: EM | Admit: 2022-11-25 | Discharge: 2022-11-25 | Disposition: A | Discharge status: Home / Self Care | Payer: Self-pay | Attending: Emergency Medicine | Admitting: Emergency Medicine

## 2022-11-25 ENCOUNTER — Encounter (HOSPITAL_COMMUNITY): Payer: Self-pay | Admitting: Emergency Medicine

## 2022-11-25 ENCOUNTER — Other Ambulatory Visit: Payer: Self-pay

## 2022-11-25 ENCOUNTER — Emergency Department (HOSPITAL_COMMUNITY): Payer: Self-pay

## 2022-11-25 DIAGNOSIS — E876 Hypokalemia: Secondary | ICD-10-CM | POA: Insufficient documentation

## 2022-11-25 DIAGNOSIS — R350 Frequency of micturition: Secondary | ICD-10-CM | POA: Insufficient documentation

## 2022-11-25 DIAGNOSIS — E871 Hypo-osmolality and hyponatremia: Secondary | ICD-10-CM | POA: Insufficient documentation

## 2022-11-25 DIAGNOSIS — I10 Essential (primary) hypertension: Secondary | ICD-10-CM | POA: Insufficient documentation

## 2022-11-25 DIAGNOSIS — Z79899 Other long term (current) drug therapy: Secondary | ICD-10-CM | POA: Insufficient documentation

## 2022-11-25 DIAGNOSIS — E860 Dehydration: Secondary | ICD-10-CM | POA: Insufficient documentation

## 2022-11-25 DIAGNOSIS — R1030 Lower abdominal pain, unspecified: Secondary | ICD-10-CM | POA: Insufficient documentation

## 2022-11-25 LAB — CBC WITH DIFFERENTIAL/PLATELET
Abs Immature Granulocytes: 0.03 10*3/uL (ref 0.00–0.07)
Basophils Absolute: 0 10*3/uL (ref 0.0–0.1)
Basophils Relative: 0 %
Eosinophils Absolute: 0.1 10*3/uL (ref 0.0–0.5)
Eosinophils Relative: 1 %
HCT: 42.5 % (ref 39.0–52.0)
Hemoglobin: 15 g/dL (ref 13.0–17.0)
Immature Granulocytes: 0 %
Lymphocytes Relative: 15 %
Lymphs Abs: 1.4 10*3/uL (ref 0.7–4.0)
MCH: 30 pg (ref 26.0–34.0)
MCHC: 35.3 g/dL (ref 30.0–36.0)
MCV: 85 fL (ref 80.0–100.0)
Monocytes Absolute: 0.8 10*3/uL (ref 0.1–1.0)
Monocytes Relative: 9 %
Neutro Abs: 6.9 10*3/uL (ref 1.7–7.7)
Neutrophils Relative %: 75 %
Platelets: 300 10*3/uL (ref 150–400)
RBC: 5 MIL/uL (ref 4.22–5.81)
RDW: 12.2 % (ref 11.5–15.5)
WBC: 9.2 10*3/uL (ref 4.0–10.5)
nRBC: 0 % (ref 0.0–0.2)

## 2022-11-25 LAB — URINALYSIS, ROUTINE W REFLEX MICROSCOPIC
Bilirubin Urine: NEGATIVE
Glucose, UA: 50 mg/dL — AB
Hgb urine dipstick: NEGATIVE
Ketones, ur: NEGATIVE mg/dL
Leukocytes,Ua: NEGATIVE
Nitrite: NEGATIVE
Protein, ur: NEGATIVE mg/dL
Specific Gravity, Urine: 1.008 (ref 1.005–1.030)
pH: 7 (ref 5.0–8.0)

## 2022-11-25 LAB — COMPREHENSIVE METABOLIC PANEL
ALT: 30 U/L (ref 0–44)
AST: 25 U/L (ref 15–41)
Albumin: 3.9 g/dL (ref 3.5–5.0)
Alkaline Phosphatase: 62 U/L (ref 38–126)
Anion gap: 12 (ref 5–15)
BUN: 5 mg/dL — ABNORMAL LOW (ref 8–23)
CO2: 22 mmol/L (ref 22–32)
Calcium: 8.4 mg/dL — ABNORMAL LOW (ref 8.9–10.3)
Chloride: 93 mmol/L — ABNORMAL LOW (ref 98–111)
Creatinine, Ser: 0.9 mg/dL (ref 0.61–1.24)
GFR, Estimated: >60 mL/min (ref >60)
Glucose, Bld: 156 mg/dL — ABNORMAL HIGH (ref 70–99)
Potassium: 3.1 mmol/L — ABNORMAL LOW (ref 3.5–5.1)
Sodium: 127 mmol/L — ABNORMAL LOW (ref 135–145)
Total Bilirubin: 1 mg/dL (ref 0.3–1.2)
Total Protein: 6.5 g/dL (ref 6.5–8.1)

## 2022-11-25 LAB — LIPASE, BLOOD: Lipase: 36 U/L (ref 11–51)

## 2022-11-25 LAB — MAGNESIUM: Magnesium: 1.8 mg/dL (ref 1.7–2.4)

## 2022-11-25 MED ORDER — SODIUM CHLORIDE 0.9 % IV BOLUS
1000.0000 mL | Freq: Once | INTRAVENOUS | Status: AC
  Administered 2022-11-25: 1000 mL via INTRAVENOUS

## 2022-11-25 MED ORDER — POTASSIUM CHLORIDE CRYS ER 20 MEQ PO TBCR
40.0000 meq | EXTENDED_RELEASE_TABLET | Freq: Once | ORAL | Status: AC
  Administered 2022-11-25: 40 meq via ORAL
  Filled 2022-11-25: qty 2

## 2022-11-25 MED ORDER — OXYCODONE-ACETAMINOPHEN 5-325 MG PO TABS
1.0000 | ORAL_TABLET | Freq: Once | ORAL | Status: AC
  Administered 2022-11-25: 1 via ORAL
  Filled 2022-11-25: qty 1

## 2022-11-25 MED ORDER — IOPAMIDOL (ISOVUE-370) INJECTION 76%
75.0000 mL | Freq: Once | INTRAVENOUS | Status: AC | PRN
  Administered 2022-11-25: 75 mL via INTRAVENOUS

## 2022-11-25 NOTE — Discharge Instructions (Signed)
Your CT imaging did not show any new findings.  Your lab work did show low sodium and low potassium.  You were given IV fluids and a potassium supplement while in the emergency department.  You should follow-up with your primary care doctor for repeat lab work within the next week to ensure that these electrolyte levels are returning to normal.  Please return to the emergency department at anytime for any new or worsening symptoms of concern.

## 2022-11-25 NOTE — ED Triage Notes (Addendum)
Pt c/o lower abdominal and back pain for approx 1 week. Pt describes the pain as "irritation." Denies n/v/d. Pt states he received testosterone injection approx 1 week ago. Pt also endorses groin pain, bilateral arm and leg pain.

## 2022-11-25 NOTE — ED Provider Notes (Signed)
[facility] Provider Note   CSN: [id number] Arrival date & time: 11/25/22  1504     History  Chief Complaint  Patient presents with   Abdominal Pain    Nathan Cherry is a 62 y.o. male.  The history is provided by the patient. The history is limited by a language barrier. A language interpreter was used.  Abdominal Pain Patient presents for lower abdominal pain.  Medical history includes HTN, anxiety, BPH, HLD, IBS. He has had urinary frequency and urgency.  He is not aware of a previous BPH diagnosis.  He describes a central, lower abdominal "irritation".  This has been ongoing for the past month.  He also describes a "irritation" in his lower back.  He denies any recent fevers, chills, nausea, or other areas of discomfort.  He is currently on medication for treatment of H. pylori.  This includes amoxicillin and metronidazole.  He states that he was prescribed these for 2 weeks.  Although he states he started it 1 week ago, Flagyl bottle was empty and amoxicillin bottle has 5 tablets left.  Patient states that he drank 1 beer today but was not drinking alcohol while taking his Flagyl.  Last bowel movement was today.     Home Medications Prior to Admission medications   Medication Sig Start Date End Date Taking? Authorizing Provider  acyclovir (ZOVIRAX) 200 MG capsule 2 capsules by mouth twice daily Patient taking differently: Take 200 mg by mouth 2 (two) times daily as needed (outbreaks).  07/27/15   Mack Hook, MD  amLODipine (NORVASC) 10 MG tablet Take 10 mg by mouth every evening.    [provider]  atenolol (TENORMIN) 100 MG tablet Take 100 mg by mouth daily.    [provider]  Cyanocobalamin (VITAMIN B-12 PO) Take 1 capsule by mouth daily.    [provider]  dicyclomine (BENTYL) 20 MG tablet Take 1 tablet (20 mg total) by mouth 4 (four) times daily as needed (intestinal cramps). 10/20/22    Barrett Henle, MD  omeprazole (PRILOSEC) 20 MG capsule TAKE 1 CAPSULE BY MOUTH DAILY. TAKE 30 MINUTES BEFORE BREAKFAST Patient taking differently: Take 20 mg by mouth daily as needed (heartburn and indigestion). TAKE 30 MINUTES BEFORE BREAKFAST 11/12/15   Tresa Garter, MD  pantoprazole (PROTONIX) 20 MG tablet Take 1 tablet (20 mg total) by mouth 2 (two) times daily. 02/11/20 03/12/20  Chase Picket, MD  DEPO-TESTOSTERONE 200 MG/ML injection INJECT 1 ML (200 MG TOTAL) INTO THE MUSCLE EVERY 28 DAYS Patient not taking: Reported on 06/18/2018 06/14/16 02/11/20  Tresa Garter, MD  gemfibrozil (LOPID) 600 MG tablet TAKE ONE TABLET BY MOUTH TWICE DAILY WITH MEALS Patient not taking: Reported on 06/18/2018 07/18/16 02/11/20  Mack Hook, MD  metoprolol tartrate (LOPRESSOR) 25 MG tablet TAKE ONE TABLET BY MOUTH TWICE DAILY WITH MEALS Patient not taking: Reported on 06/18/2018 07/18/16 02/11/20  Mack Hook, MD  sertraline (ZOLOFT) 50 MG tablet TAKE ONE-HALF TABLET BY MOUTH ONCE DAILY BEFORE MEAL(S) Patient not taking: Reported on 06/18/2018 07/18/16 02/11/20  Mack Hook, MD      Allergies    Doxycycline    Review of Systems   Review of Systems  Gastrointestinal:  Positive for abdominal pain.  Genitourinary:  Positive for frequency and urgency.  All other systems reviewed and are negative.   Physical Exam Updated Vital Signs BP (!) 116/58   Pulse 90   Temp 98 F (36.7  C) (Oral)   Resp 16   Ht 5\' 6"  (1.676 m)   Wt 96.2 kg   SpO2 96%   BMI 34.22 kg/m  Physical Exam Vitals and nursing note reviewed.  Constitutional:      General: He is not in acute distress.    Appearance: He is well-developed. He is not ill-appearing, toxic-appearing or diaphoretic.  HENT:     Head: Normocephalic and atraumatic.     Mouth/Throat:     Mouth: Mucous membranes are moist.  Eyes:     General: No scleral icterus.    Extraocular Movements: Extraocular movements intact.      Conjunctiva/sclera: Conjunctivae normal.  Cardiovascular:     Rate and Rhythm: Normal rate and regular rhythm.  Pulmonary:     Effort: Pulmonary effort is normal. No respiratory distress.  Abdominal:     Palpations: Abdomen is soft.     Tenderness: There is abdominal tenderness in the suprapubic area. There is no right CVA tenderness, left CVA tenderness, guarding or rebound.  Musculoskeletal:        General: No swelling.     Cervical back: Neck supple.  Skin:    General: Skin is warm and dry.     Capillary Refill: Capillary refill takes less than 2 seconds.  Neurological:     General: No focal deficit present.     Mental Status: He is alert and oriented to person, place, and time.  Psychiatric:        Mood and Affect: Mood normal.        Behavior: Behavior normal.     ED Results / Procedures / Treatments   Labs (all labs ordered are listed, but only abnormal results are displayed) Labs Reviewed  COMPREHENSIVE METABOLIC PANEL - Abnormal; Notable for the following components:      Result Value   Sodium 127 (*)    Potassium 3.1 (*)    Chloride 93 (*)    Glucose, Bld 156 (*)    BUN 5 (*)    Calcium 8.4 (*)    All other components within normal limits  URINALYSIS, ROUTINE W REFLEX MICROSCOPIC - Abnormal; Notable for the following components:   Color, Urine STRAW (*)    Glucose, UA 50 (*)    All other components within normal limits  CBC WITH DIFFERENTIAL/PLATELET  LIPASE, BLOOD  MAGNESIUM    EKG None  Radiology CT ABDOMEN PELVIS W CONTRAST  Result Date: 11/25/2022 CLINICAL DATA:  Acute abdominal pain. EXAM: CT ABDOMEN AND PELVIS WITH CONTRAST TECHNIQUE: Multidetector CT imaging of the abdomen and pelvis was performed using the standard protocol following bolus administration of intravenous contrast. RADIATION DOSE REDUCTION: This exam was performed according to the departmental dose-optimization program which includes automated exposure control, adjustment of the mA  and/or kV according to patient size and/or use of iterative reconstruction technique. CONTRAST:  7mL ISOVUE-370 IOPAMIDOL (ISOVUE-370) INJECTION 76% COMPARISON:  CT abdomen pelvis dated 03/17/2013. FINDINGS: Lower chest: There is mild bibasilar atelectasis. Hepatobiliary: No focal liver abnormality is seen. No gallstones, gallbladder wall thickening, or biliary dilatation. Pancreas: Unremarkable. No pancreatic ductal dilatation or surrounding inflammatory changes. Spleen: Normal in size without focal abnormality. Adrenals/Urinary Tract: Adrenal glands are unremarkable. Kidneys are normal, without renal calculi, focal lesion, or hydronephrosis. Bladder is unremarkable. Stomach/Bowel: Stomach is within normal limits. Appendix appears normal. No evidence of bowel wall thickening, distention, or inflammatory changes. Vascular/Lymphatic: No significant vascular findings are present. No enlarged abdominal or pelvic lymph nodes. Reproductive: The prostate indents  the under surface of the bladder. Other: No abdominal wall hernia or abnormality. No abdominopelvic ascites. Musculoskeletal: Mild degenerative changes are seen in the spine. IMPRESSION: 1. No acute process in the abdomen or pelvis. Electronically Signed   By: Zerita Boers M.D.   On: 11/25/2022 18:56    Procedures Procedures    Medications Ordered in ED Medications  sodium chloride 0.9 % bolus 1,000 mL (has no administration in time range)  oxyCODONE-acetaminophen (PERCOCET/ROXICET) 5-325 MG per tablet 1 tablet (1 tablet Oral Given 11/25/22 1852)  iopamidol (ISOVUE-370) 76 % injection 75 mL (75 mLs Intravenous Contrast Given 11/25/22 1800)  sodium chloride 0.9 % bolus 1,000 mL (1,000 mLs Intravenous New Bag/Given 11/25/22 1853)  potassium chloride SA (KLOR-CON M) CR tablet 40 mEq (40 mEq Oral Given 11/25/22 1853)    ED Course/ Medical Decision Making/ A&P                             Medical Decision Making Amount and/or Complexity of Data  Reviewed Labs: ordered. Radiology: ordered.  Risk Prescription drug management.   This patient presents to the ED for concern of lower abdominal discomfort, this involves an extensive number of treatment options, and is a complaint that carries with it a high risk of complications and morbidity.  The differential diagnosis includes urine retention, cystitis, constipation, IBS, IBD, colitis   Co morbidities that complicate the patient evaluation  HTN, anxiety, BPH, HLD, IBS   Additional history obtained:  Additional history obtained from N/A External records from outside source obtained and reviewed including EMR   Lab Tests:  I Ordered, and personally interpreted labs.  The pertinent results include: Normal hemoglobin, no leukocytosis, no evidence of UTI, normal creatinine, hyponatremia and hypokalemia are present.   Imaging Studies ordered:  I ordered imaging studies including CT of abdomen and pelvis I independently visualized and interpreted imaging which showed no acute findings I agree with the radiologist interpretation   Cardiac Monitoring: / EKG:  The patient was maintained on a cardiac monitor.  I personally viewed and interpreted the cardiac monitored which showed an underlying rhythm of: Sinus rhythm  Problem List / ED Course / Critical interventions / Medication management  Patient presents for lower abdominal "irritation".  He has a documented history of BPH and IBS.  He describes urinary urgency and frequency.  On assessment, patient is well-appearing.  Vital signs are normal on arrival in the ED.  Patient endorses some lower abdominal tenderness.  He also describes a lower back "irritation", however, there is no CVA tenderness or midline tenderness.  Patient urinated 200 cc at bedside.  On bedside ultrasound, he has approximately 200 cc postvoid residual.  Urinalysis does not show evidence of infection.  Per chart review, he was seen at urgent care a month ago  for lower abdominal discomfort.  He was prescribed Bentyl.  Patient was given Percocet for analgesia.  CT imaging was ordered.  Lab work was notable for hyponatremia and hypokalemia.  1 bolus of IV fluid was ordered in addition to potassium chloride.  CT imaging did not show any acute findings.  Patient reports significantly improved symptoms following 1 L bolus of IVF.  He feels that he may become dehydrated.  Additional liter of IVF was ordered.  Patient does have a documented history of BPH and IBS.  I feel that these are contributing to his symptoms.  He is currently on Flomax.  He has been  prescribed Bentyl but is no longer taking it.  Patient was advised to follow-up with his primary care doctor for repeat lab work to ensure that hyponatremia and hypokalemia are correcting.  He was given food in the ED and able to tolerate p.o. intake without difficulty.  He was discharged in stable condition. I ordered medication including IV fluids for hydration; Percocet for analgesia; potassium chloride for hypokalemia Reevaluation of the patient after these medicines showed that the patient resolved I have reviewed the patients home medicines and have made adjustments as needed   Social Determinants of Health:  Has PCP        Final Clinical Impression(s) / ED Diagnoses Final diagnoses:  Dehydration  Hyponatremia  Hypokalemia  Lower abdominal pain    Rx / DC Orders ED Discharge Orders     None         Godfrey Pick, MD 11/25/22 2109

## 2022-11-25 NOTE — ED Notes (Signed)
Pt went to CT with staff.

## 2022-11-25 NOTE — ED Provider Triage Note (Signed)
Emergency Medicine Provider Triage Evaluation Note  Nathan Cherry , a 62 y.o. male  was evaluated in triage.  Pt complains of lower abdominal groin pain.  This began approximate 1 week ago and has progressively gotten worse.  Endorses urinary frequency and urgency.  Denies dysuria.  Endorses weak stream.  Also states that his arms and legs are fatigued.  Currently being treated for H. pylori  Review of Systems  Positive: As above Negative: As above  Physical Exam  BP 121/77 (BP Location: Left Arm)   Pulse 94   Temp 97.8 F (36.6 C) (Oral)   Resp 18   Ht 5\' 6"  (1.676 m)   Wt 96.2 kg   SpO2 99%   BMI 34.22 kg/m  Gen:   Awake, no distress   Resp:  Normal effort  MSK:   Moves extremities without difficulty  Other:  No abdominal TTP  Medical Decision Making  Medically screening exam initiated at 4:31 PM.  Appropriate orders placed.  Nathan Cherry was informed that the remainder of the evaluation will be completed by another provider, this initial triage assessment does not replace that evaluation, and the importance of remaining in the ED until their evaluation is complete.     Roylene Reason, [place] 11/25/22 [phone]

## 2024-01-17 ENCOUNTER — Encounter (HOSPITAL_COMMUNITY): Payer: Self-pay | Admitting: *Deleted

## 2024-01-17 ENCOUNTER — Ambulatory Visit (HOSPITAL_COMMUNITY)
Admission: EM | Admit: 2024-01-17 | Discharge: 2024-01-17 | Disposition: A | Discharge status: Home / Self Care | Payer: Self-pay | Attending: Family Medicine | Admitting: Family Medicine

## 2024-01-17 DIAGNOSIS — R519 Headache, unspecified: Secondary | ICD-10-CM | POA: Insufficient documentation

## 2024-01-17 DIAGNOSIS — I10 Essential (primary) hypertension: Secondary | ICD-10-CM | POA: Insufficient documentation

## 2024-01-17 DIAGNOSIS — R109 Unspecified abdominal pain: Secondary | ICD-10-CM | POA: Insufficient documentation

## 2024-01-17 DIAGNOSIS — R42 Dizziness and giddiness: Secondary | ICD-10-CM | POA: Insufficient documentation

## 2024-01-17 LAB — LIPID PANEL
Cholesterol: 176 mg/dL (ref 0–200)
HDL: 43 mg/dL (ref >40)
LDL Cholesterol: 116 mg/dL — ABNORMAL HIGH (ref 0–99)
Total CHOL/HDL Ratio: 4.1 ratio
Triglycerides: 87 mg/dL (ref <150)
VLDL: 17 mg/dL (ref 0–40)

## 2024-01-17 LAB — CBC WITH DIFFERENTIAL/PLATELET
Abs Immature Granulocytes: 0.03 10*3/uL (ref 0.00–0.07)
Basophils Absolute: 0.1 10*3/uL (ref 0.0–0.1)
Basophils Relative: 1 %
Eosinophils Absolute: 0.2 10*3/uL (ref 0.0–0.5)
Eosinophils Relative: 2 %
HCT: 50 % (ref 39.0–52.0)
Hemoglobin: 17.7 g/dL — ABNORMAL HIGH (ref 13.0–17.0)
Immature Granulocytes: 0 %
Lymphocytes Relative: 22 %
Lymphs Abs: 1.7 10*3/uL (ref 0.7–4.0)
MCH: 30.5 pg (ref 26.0–34.0)
MCHC: 35.4 g/dL (ref 30.0–36.0)
MCV: 86.1 fL (ref 80.0–100.0)
Monocytes Absolute: 0.6 10*3/uL (ref 0.1–1.0)
Monocytes Relative: 8 %
Neutro Abs: 5.1 10*3/uL (ref 1.7–7.7)
Neutrophils Relative %: 67 %
Platelets: 347 10*3/uL (ref 150–400)
RBC: 5.81 MIL/uL (ref 4.22–5.81)
RDW: 12.6 % (ref 11.5–15.5)
WBC: 7.8 10*3/uL (ref 4.0–10.5)
nRBC: 0 % (ref 0.0–0.2)

## 2024-01-17 LAB — COMPREHENSIVE METABOLIC PANEL WITH GFR
ALT: 62 U/L — ABNORMAL HIGH (ref 0–44)
AST: 46 U/L — ABNORMAL HIGH (ref 15–41)
Albumin: 4.4 g/dL (ref 3.5–5.0)
Alkaline Phosphatase: 49 U/L (ref 38–126)
Anion gap: 12 (ref 5–15)
BUN: 7 mg/dL — ABNORMAL LOW (ref 8–23)
CO2: 24 mmol/L (ref 22–32)
Calcium: 9.2 mg/dL (ref 8.9–10.3)
Chloride: 101 mmol/L (ref 98–111)
Creatinine, Ser: 0.77 mg/dL (ref 0.61–1.24)
GFR, Estimated: >60 mL/min (ref >60)
Glucose, Bld: 97 mg/dL (ref 70–99)
Potassium: 3.8 mmol/L (ref 3.5–5.1)
Sodium: 137 mmol/L (ref 135–145)
Total Bilirubin: 0.9 mg/dL (ref 0.0–1.2)
Total Protein: 7.9 g/dL (ref 6.5–8.1)

## 2024-01-17 MED ORDER — HYDROXYZINE HCL 25 MG PO TABS: 25.0000 mg | ORAL_TABLET | Freq: Three times a day (TID) | ORAL | 0 refills | Status: DC | PRN

## 2024-01-17 MED ORDER — ATENOLOL 100 MG PO TABS: 100.0000 mg | ORAL_TABLET | Freq: Every day | ORAL | 0 refills | Status: DC

## 2024-01-17 MED ORDER — LISINOPRIL 20 MG PO TABS: 20.0000 mg | ORAL_TABLET | Freq: Every day | ORAL | 1 refills | Status: DC

## 2024-01-17 MED ORDER — AMLODIPINE BESYLATE 10 MG PO TABS: 10.0000 mg | ORAL_TABLET | Freq: Every evening | ORAL | 0 refills | Status: DC

## 2024-01-17 MED ORDER — PANTOPRAZOLE SODIUM 20 MG PO TBEC: 20.0000 mg | DELAYED_RELEASE_TABLET | Freq: Two times a day (BID) | ORAL | 0 refills | Status: DC

## 2024-01-17 NOTE — ED Triage Notes (Signed)
 Pt states he has had headache, dizziness and hypertension the last 3 days. He states that he stopped taking his meds X 1 week last week and restarted 3 days ago when his sx started. He states that his BP this morning was 165/70. He did take his BP meds this morning.

## 2024-01-17 NOTE — ED Provider Notes (Signed)
 [facility]    CSN: [id number] Arrival date & time: 01/17/24  6213      History   Chief Complaint Chief Complaint  Patient presents with   Headache   Hypertension   Dizziness    HPI Nathan Cherry is a 63 y.o. male.    Headache Associated symptoms: abdominal pain and dizziness   Hypertension Associated symptoms include abdominal pain and headaches. Pertinent negatives include no chest pain and no shortness of breath.  Dizziness Associated symptoms: headaches   Associated symptoms: no chest pain, no palpitations and no shortness of breath    Patient is here for 3 days of headache, dizziness and elevated blood pressure.  He noted tension in his upper back, neck.  He now feels pain in his stomach.  No chest pain.  No n/v.  He was not taking his bp medications, but he has since restarted his mediation 3 days ago.  He does not feel the medication has really helped the bp.  He is taking atenolol 100mg  and norvasc 10mg .   He also take hydroxyzine 25mg  at bed for stress.  He did take a dose last night but almost out.  Requesting a refill on this .  He states his clinic has closed, and needs to find a new pcp.  He uses pantoprazole  20mg  prn for gerd.  He needs a refill of this.  He states this pain at this time is different than his gerd.        Past Medical History:  Diagnosis Date   Allergy    Anxiety    History of anger issues related to this.     BPH without obstruction/lower urinary tract symptoms    Has had in past--but has been asymptomatic in 2016   External hemorrhoids    Genital herpes    Herpes    Hyperlipidemia    Hypertension    IBS (irritable bowel syndrome)    Associated with anxiety   Low testosterone     PUD (peptic ulcer disease) 2014    Patient Active Problem List   Diagnosis Date Noted   Genital herpes 07/27/2015   Allergy 08/24/2014   Pain in joint, shoulder region 08/24/2014   Gingivitis, chronic 05/28/2014   Neck  muscle spasm 02/16/2014   Abdominal pain, other specified site 02/16/2014   Low testosterone  02/16/2014   Migraine 01/05/2014   Anal pruritus 07/07/2013   Generalized anxiety disorder 07/07/2013   Allergic sinusitis 06/16/2013   HTN (hypertension) 06/16/2013    History reviewed. No pertinent surgical history.     Home Medications    Prior to Admission medications   Medication Sig Start Date End Date Taking? Authorizing Provider  amLODipine (NORVASC) 10 MG tablet Take 10 mg by mouth every evening.   Yes [provider]  atenolol (TENORMIN) 100 MG tablet Take 100 mg by mouth daily.   Yes [provider]  acyclovir  (ZOVIRAX ) 200 MG capsule 2 capsules by mouth twice daily Patient taking differently: Take 200 mg by mouth 2 (two) times daily as needed (outbreaks). 07/27/15   Ronalee Cocking, MD  Cyanocobalamin (VITAMIN B-12 PO) Take 1 capsule by mouth daily.    [provider]  dicyclomine  (BENTYL ) 20 MG tablet Take 1 tablet (20 mg total) by mouth 4 (four) times daily as needed (intestinal cramps). 10/20/22   Ann Keto, MD  omeprazole  (PRILOSEC) 20 MG capsule TAKE 1 CAPSULE BY MOUTH DAILY. TAKE 30 MINUTES BEFORE BREAKFAST Patient taking differently: Take 20  mg by mouth daily as needed (heartburn and indigestion). TAKE 30 MINUTES BEFORE BREAKFAST 11/12/15   Jegede, Olugbemiga E, MD  pantoprazole  (PROTONIX ) 20 MG tablet Take 1 tablet (20 mg total) by mouth 2 (two) times daily. 02/11/20 03/12/20  Corine Dice, MD  DEPO-TESTOSTERONE  200 MG/ML injection INJECT 1 ML (200 MG TOTAL) INTO THE MUSCLE EVERY 28 DAYS Patient not taking: Reported on 06/18/2018 06/14/16 02/11/20  Jegede, Olugbemiga E, MD  gemfibrozil  (LOPID ) 600 MG tablet TAKE ONE TABLET BY MOUTH TWICE DAILY WITH MEALS Patient not taking: Reported on 06/18/2018 07/18/16 02/11/20  Ronalee Cocking, MD  metoprolol  tartrate (LOPRESSOR ) 25 MG tablet TAKE ONE TABLET BY MOUTH TWICE DAILY WITH MEALS Patient not  taking: Reported on 06/18/2018 07/18/16 02/11/20  Ronalee Cocking, MD  sertraline (ZOLOFT) 50 MG tablet TAKE ONE-HALF TABLET BY MOUTH ONCE DAILY BEFORE MEAL(S) Patient not taking: Reported on 06/18/2018 07/18/16 02/11/20  Ronalee Cocking, MD    Family History History reviewed. No pertinent family history.  Social History Social History   Tobacco Use   Smoking status: Never   Smokeless tobacco: Never  Substance Use Topics   Alcohol use: No    Alcohol/week: 0.0 standard drinks of alcohol    Comment: No alcohol since 2005.  History of drinking too much.   Drug use: No     Allergies   Doxycycline   Review of Systems Review of Systems  HENT: Negative.    Respiratory:  Negative for shortness of breath.   Cardiovascular:  Negative for chest pain and palpitations.  Gastrointestinal:  Positive for abdominal pain.  Genitourinary: Negative.   Musculoskeletal: Negative.   Neurological:  Positive for dizziness and headaches.  Psychiatric/Behavioral: Negative.       Physical Exam Triage Vital Signs ED Triage Vitals [01/17/24 1000]  Encounter Vitals Group     BP (!) 150/92     Systolic BP Percentile      Diastolic BP Percentile      Pulse Rate 75     Resp 18     Temp 98.6 F (37 C)     Temp Source Oral     SpO2 96 %     Weight      Height      Head Circumference      Peak Flow      Pain Score      Pain Loc      Pain Education      Exclude from Growth Chart    No data found.  Updated Vital Signs BP (!) 150/92 (BP Location: Right Arm)   Pulse 75   Temp 98.6 F (37 C) (Oral)   Resp 18   SpO2 96%   Visual Acuity Right Eye Distance:   Left Eye Distance:   Bilateral Distance:    Right Eye Near:   Left Eye Near:    Bilateral Near:     Physical Exam Constitutional:      General: He is not in acute distress.    Appearance: He is well-developed and normal weight. He is not ill-appearing or toxic-appearing.  HENT:     Head: Normocephalic.     Mouth/Throat:      Mouth: Mucous membranes are moist.  Eyes:     Extraocular Movements: Extraocular movements intact.     Pupils: Pupils are equal, round, and reactive to light.  Cardiovascular:     Rate and Rhythm: Normal rate and regular rhythm.  Pulmonary:     Effort: Pulmonary effort is  normal.     Breath sounds: Normal breath sounds. No wheezing.  Abdominal:     Palpations: Abdomen is soft.     Comments: Slight epigastric tenderness  Musculoskeletal:        General: Normal range of motion.     Cervical back: Normal range of motion and neck supple.  Lymphadenopathy:     Cervical: No cervical adenopathy.  Skin:    General: Skin is warm.  Neurological:     Mental Status: He is alert and oriented to person, place, and time.  Psychiatric:        Mood and Affect: Mood normal.      UC Treatments / Results  Labs (all labs ordered are listed, but only abnormal results are displayed) Labs Reviewed  COMPREHENSIVE METABOLIC PANEL WITH GFR  CBC WITH DIFFERENTIAL/PLATELET  LIPID PANEL    EKG NSR;  normal EKG  Radiology No results found.  Procedures Procedures (including critical care time)  Medications Ordered in UC Medications - No data to display  Initial Impression / Assessment and Plan / UC Course  I have reviewed the triage vital signs and the nursing notes.  Pertinent labs & imaging results that were available during my care of the patient were reviewed by me and considered in my medical decision making (see chart for details).   Final Clinical Impressions(s) / UC Diagnoses   Final diagnoses:  Hypertension, unspecified type  Nonintractable headache, unspecified chronicity pattern, unspecified headache type  Dizziness  Abdominal pain, unspecified abdominal location     Discharge Instructions      Lo revisaron hoy por presin arterial alta y no se senta bien. Su presin arterial est levemente elevada hoy, pero no lo suficiente como para causarle sntomas. Su  electrocardiograma (ECG) de hoy sali normal. He renovado sus medicamentos y le he aadido otro para la presin arterial. Debe seguir controlando su presin arterial. Tambin le he pedido anlisis de sangre hoy para realizar ms pruebas. Le llamaremos si encuentra algo anormal. Puede tomar Tylenol  (acetaminofn) para el dolor de cabeza. No tome Motrin /Advil [place], ya que pueden aumentar su presin arterial. Tambin tome pantoprazol para el dolor abdominal. Por favor, consulte con un mdico de cabecera. He derivado a Administrator, sports o puede visitar [email or web address] para encontrar uno cerca de usted. Si sus sntomas empeoran o persisten a pesar de la medicacin, acuda a urgencias para una evaluacin.  You were seen today for elevated blood pressure and not feeling well.  Your blood pressure is mildly elevated today, but not high enough to cause your symptoms. Your EKG was normal today.  I have refilled your medications, and have added another blood pressure medication to take. You should continue to monitor your blood pressure.  I have ordered blood work today for further testing as well.  You will be called if anything is abnormal.  You may take tylenol  (acetaminophen ) for headache.  Do not take motrin /advil [place] as this may increase your blood pressure.  Please take you pantoprazole  for your abdominal pain as well.  Please follow up with a primary care provider.  I have placed a referral, or you may go [email or web address] to find one near you.  If you have worsening or continued symptoms despite medication then please go to the ER for evaluation.   ED Prescriptions     Medication Sig Dispense Auth. Provider   amLODipine (NORVASC) 10 MG tablet Take 1 tablet (10 mg total) by mouth every evening. 90 tablet Lesle Ras, MD   atenolol (  TENORMIN) 100 MG tablet Take 1 tablet (100 mg total) by mouth daily. 90 tablet Ajanae Virag, MD   pantoprazole  (PROTONIX ) 20 MG tablet Take 1 tablet (20 mg total)  by mouth 2 (two) times daily. 180 tablet Antoni Stefan, MD   lisinopril (ZESTRIL) 20 MG tablet Take 1 tablet (20 mg total) by mouth daily. 30 tablet Leler Brion, MD   hydrOXYzine (ATARAX) 25 MG tablet Take 1 tablet (25 mg total) by mouth every 8 (eight) hours as needed. 60 tablet Lesle Ras, MD      PDMP not reviewed this encounter.   Lesle Ras, MD 01/17/24 1139

## 2024-01-17 NOTE — Discharge Instructions (Signed)
 Lo revisaron hoy por presin arterial alta y no se senta bien. Su presin arterial est levemente elevada hoy, pero no lo suficiente como para causarle sntomas. Su electrocardiograma (ECG) de hoy sali normal. He renovado sus medicamentos y le he aadido otro para la presin arterial. Debe seguir controlando su presin arterial. Tambin le he pedido anlisis de sangre hoy para realizar ms pruebas. Le llamaremos si encuentra algo anormal. Puede tomar Tylenol  (acetaminofn) para el dolor de cabeza. No tome Motrin /Advil [place], ya que pueden aumentar su presin arterial. Tambin tome pantoprazol para el dolor abdominal. Por favor, consulte con un mdico de cabecera. He derivado a Administrator, sports o puede visitar [email or web address] para encontrar uno cerca de usted. Si sus sntomas empeoran o persisten a pesar de la medicacin, acuda a urgencias para una evaluacin.  You were seen today for elevated blood pressure and not feeling well.  Your blood pressure is mildly elevated today, but not high enough to cause your symptoms. Your EKG was normal today.  I have refilled your medications, and have added another blood pressure medication to take. You should continue to monitor your blood pressure.  I have ordered blood work today for further testing as well.  You will be called if anything is abnormal.  You may take tylenol  (acetaminophen ) for headache.  Do not take motrin /advil [place] as this may increase your blood pressure.  Please take you pantoprazole  for your abdominal pain as well.  Please follow up with a primary care provider.  I have placed a referral, or you may go [email or web address] to find one near you.  If you have worsening or continued symptoms despite medication then please go to the ER for evaluation.

## 2024-02-14 ENCOUNTER — Ambulatory Visit (HOSPITAL_COMMUNITY)
Admission: EM | Admit: 2024-02-14 | Discharge: 2024-02-14 | Disposition: A | Discharge status: Home / Self Care | Payer: Self-pay | Attending: Internal Medicine | Admitting: Internal Medicine

## 2024-02-14 ENCOUNTER — Encounter (HOSPITAL_COMMUNITY): Payer: Self-pay

## 2024-02-14 DIAGNOSIS — Z0189 Encounter for other specified special examinations: Secondary | ICD-10-CM | POA: Insufficient documentation

## 2024-02-14 DIAGNOSIS — I10 Essential (primary) hypertension: Secondary | ICD-10-CM | POA: Insufficient documentation

## 2024-02-14 LAB — COMPREHENSIVE METABOLIC PANEL WITH GFR
ALT: 45 U/L — ABNORMAL HIGH (ref 0–44)
AST: 33 U/L (ref 15–41)
Albumin: 4.1 g/dL (ref 3.5–5.0)
Alkaline Phosphatase: 56 U/L (ref 38–126)
Anion gap: 9 (ref 5–15)
BUN: 6 mg/dL — ABNORMAL LOW (ref 8–23)
CO2: 27 mmol/L (ref 22–32)
Calcium: 9.2 mg/dL (ref 8.9–10.3)
Chloride: 101 mmol/L (ref 98–111)
Creatinine, Ser: 0.85 mg/dL (ref 0.61–1.24)
GFR, Estimated: >60 mL/min (ref >60)
Glucose, Bld: 87 mg/dL (ref 70–99)
Potassium: 3.8 mmol/L (ref 3.5–5.1)
Sodium: 137 mmol/L (ref 135–145)
Total Bilirubin: 0.8 mg/dL (ref 0.0–1.2)
Total Protein: 6.9 g/dL (ref 6.5–8.1)

## 2024-02-14 NOTE — ED Triage Notes (Signed)
 Patient reports that he was called for a repeat in his blood work to check for elevated liver enzymes. Patient is requesting a PSA test as well.

## 2024-02-14 NOTE — Discharge Instructions (Addendum)
 Liver previously were only mildly elevated but will recheck today. We will set you up with a primary care provider today to follow up with the request for PSA testing and to establish care for routine blood work and routine care. Can follow up at urgent care as needed.

## 2024-02-14 NOTE — ED Provider Notes (Signed)
 [facility]    CSN: [id number] Arrival date & time: 02/14/24  1043      History   Chief Complaint No chief complaint on file.   HPI Nathan Cherry is a 63 y.o. male.   63 y.o. male who presents to urgent care with complaints of needing to recheck his blood work. He was seen 01/17/2024 and had lab work done. He was told his liver enzymes were mildly elevated and that he should follow up with a PCP for this. He reports he does not have a PCP and was told he could come here for his routine blood work. He denies any symptoms at current. He is requesting a PSA test as well and reports he was getting this at his old PCP but they closed. Denies any urinary retention, frequency, urgency, hematuria.      Past Medical History:  Diagnosis Date   Allergy    Anxiety    History of anger issues related to this.     BPH without obstruction/lower urinary tract symptoms    Has had in past--but has been asymptomatic in 2016   External hemorrhoids    Genital herpes    Herpes    Hyperlipidemia    Hypertension    IBS (irritable bowel syndrome)    Associated with anxiety   Low testosterone     PUD (peptic ulcer disease) 2014    Patient Active Problem List   Diagnosis Date Noted   Genital herpes 07/27/2015   Allergy 08/24/2014   Pain in joint, shoulder region 08/24/2014   Gingivitis, chronic 05/28/2014   Neck muscle spasm 02/16/2014   Abdominal pain, other specified site 02/16/2014   Low testosterone  02/16/2014   Migraine 01/05/2014   Anal pruritus 07/07/2013   Generalized anxiety disorder 07/07/2013   Allergic sinusitis 06/16/2013   HTN (hypertension) 06/16/2013    History reviewed. No pertinent surgical history.     Home Medications    Prior to Admission medications   Medication Sig Start Date End Date Taking? Authorizing Provider  acyclovir  (ZOVIRAX ) 200 MG capsule 2 capsules by mouth twice daily Patient taking differently: Take 200 mg by mouth 2 (two) times  daily as needed (outbreaks). 07/27/15   Ronalee Cocking, MD  amLODipine  (NORVASC ) 10 MG tablet Take 1 tablet (10 mg total) by mouth every evening. 01/17/24   Piontek, Cleveland Dales, MD  atenolol  (TENORMIN ) 100 MG tablet Take 1 tablet (100 mg total) by mouth daily. 01/17/24   Piontek, Cleveland Dales, MD  Cyanocobalamin (VITAMIN B-12 PO) Take 1 capsule by mouth daily.    [provider]  dicyclomine  (BENTYL ) 20 MG tablet Take 1 tablet (20 mg total) by mouth 4 (four) times daily as needed (intestinal cramps). 10/20/22   Ann Keto, MD  hydrOXYzine  (ATARAX ) 25 MG tablet Take 1 tablet (25 mg total) by mouth every 8 (eight) hours as needed. 01/17/24   Piontek, Cleveland Dales, MD  lisinopril  (ZESTRIL ) 20 MG tablet Take 1 tablet (20 mg total) by mouth daily. 01/17/24   Piontek, Cleveland Dales, MD  pantoprazole  (PROTONIX ) 20 MG tablet Take 1 tablet (20 mg total) by mouth 2 (two) times daily. 01/17/24 04/16/24  Lesle Ras, MD  DEPO-TESTOSTERONE  200 MG/ML injection INJECT 1 ML (200 MG TOTAL) INTO THE MUSCLE EVERY 28 DAYS Patient not taking: Reported on 06/18/2018 06/14/16 02/11/20  Jegede, Olugbemiga E, MD  gemfibrozil  (LOPID ) 600 MG tablet TAKE ONE TABLET BY MOUTH TWICE DAILY WITH MEALS Patient not taking: Reported on 06/18/2018 07/18/16 02/11/20  Ronalee Cocking,  MD  metoprolol  tartrate (LOPRESSOR ) 25 MG tablet TAKE ONE TABLET BY MOUTH TWICE DAILY WITH MEALS Patient not taking: Reported on 06/18/2018 07/18/16 02/11/20  Ronalee Cocking, MD  sertraline (ZOLOFT) 50 MG tablet TAKE ONE-HALF TABLET BY MOUTH ONCE DAILY BEFORE MEAL(S) Patient not taking: Reported on 06/18/2018 07/18/16 02/11/20  Ronalee Cocking, MD    Family History History reviewed. No pertinent family history.  Social History Social History   Tobacco Use   Smoking status: Never   Smokeless tobacco: Never  Vaping Use   Vaping status: Never Used  Substance Use Topics   Alcohol use: No    Alcohol/week: 0.0 standard drinks of alcohol    Comment: No alcohol since 2005.   History of drinking too much.   Drug use: No     Allergies   Doxycycline   Review of Systems Review of Systems  Constitutional:  Negative for chills and fever.  HENT:  Negative for ear pain and sore throat.   Eyes:  Negative for pain and visual disturbance.  Respiratory:  Negative for cough and shortness of breath.   Cardiovascular:  Negative for chest pain and palpitations.  Gastrointestinal:  Negative for abdominal pain and vomiting.  Genitourinary:  Negative for dysuria and hematuria.  Musculoskeletal:  Negative for arthralgias and back pain.  Skin:  Negative for color change and rash.  Neurological:  Negative for seizures and syncope.  All other systems reviewed and are negative.    Physical Exam Triage Vital Signs ED Triage Vitals [02/14/24 1148]  Encounter Vitals Group     BP (!) 149/88     Systolic BP Percentile      Diastolic BP Percentile      Pulse Rate (!) 52     Resp 16     Temp 98.5 F (36.9 C)     Temp Source Oral     SpO2 97 %     Weight      Height      Head Circumference      Peak Flow      Pain Score 0     Pain Loc      Pain Education      Exclude from Growth Chart    No data found.  Updated Vital Signs BP (!) 149/88 (BP Location: Left Arm)   Pulse (!) 52   Temp 98.5 F (36.9 C) (Oral)   Resp 16   SpO2 97%   Visual Acuity Right Eye Distance:   Left Eye Distance:   Bilateral Distance:    Right Eye Near:   Left Eye Near:    Bilateral Near:     Physical Exam Vitals and nursing note reviewed.  Constitutional:      General: He is not in acute distress.    Appearance: He is well-developed.  HENT:     Head: Normocephalic and atraumatic.  Eyes:     Conjunctiva/sclera: Conjunctivae normal.  Cardiovascular:     Rate and Rhythm: Normal rate and regular rhythm.     Heart sounds: No murmur heard. Pulmonary:     Effort: Pulmonary effort is normal. No respiratory distress.     Breath sounds: Normal breath sounds.  Abdominal:      Palpations: Abdomen is soft.     Tenderness: There is no abdominal tenderness.  Musculoskeletal:        General: No swelling.     Cervical back: Neck supple.  Skin:    General: Skin is warm and dry.  Capillary Refill: Capillary refill takes less than 2 seconds.  Neurological:     Mental Status: He is alert.  Psychiatric:        Mood and Affect: Mood normal.      UC Treatments / Results  Labs (all labs ordered are listed, but only abnormal results are displayed) Labs Reviewed  COMPREHENSIVE METABOLIC PANEL WITH GFR    EKG   Radiology No results found.  Procedures Procedures (including critical care time)  Medications Ordered in UC Medications - No data to display  Initial Impression / Assessment and Plan / UC Course  I have reviewed the triage vital signs and the nursing notes.  Pertinent labs & imaging results that were available during my care of the patient were reviewed by me and considered in my medical decision making (see chart for details).     Encounter for blood test  Hypertension, unspecified type   We have repeated the patient's CMP today but advised the patient the LFTs were only minimally elevated. We discussed that a PSA test is a test that needs to be done by a provider who will follow the patient regularly and can make referrals as needed. We have scheduled him a PCP appointment for him to follow up for routine lab work and routine care. Follow up at urgent care as needed.   Final Clinical Impressions(s) / UC Diagnoses   Final diagnoses:  Encounter for blood test  Hypertension, unspecified type     Discharge Instructions      Liver previously were only mildly elevated but will recheck today. We will set you up with a primary care provider today to follow up with the request for PSA testing and to establish care for routine blood work and routine care. Can follow up at urgent care as needed.    ED Prescriptions   None    PDMP not  reviewed this encounter.   Kreg Pesa, [place] 02/14/24 1304

## 2024-02-15 ENCOUNTER — Ambulatory Visit (HOSPITAL_COMMUNITY): Payer: Self-pay

## 2024-05-01 ENCOUNTER — Ambulatory Visit (INDEPENDENT_AMBULATORY_CARE_PROVIDER_SITE_OTHER): Payer: Self-pay | Admitting: Family

## 2024-05-01 ENCOUNTER — Encounter: Payer: Self-pay | Admitting: Family

## 2024-05-01 VITALS — BP 133/81 | HR 71 | Temp 98.2°F | Resp 16 | Ht 66.0 in | Wt 206.2 lb

## 2024-05-01 DIAGNOSIS — Z7689 Persons encountering health services in other specified circumstances: Secondary | ICD-10-CM

## 2024-05-01 DIAGNOSIS — K219 Gastro-esophageal reflux disease without esophagitis: Secondary | ICD-10-CM

## 2024-05-01 DIAGNOSIS — B009 Herpesviral infection, unspecified: Secondary | ICD-10-CM

## 2024-05-01 DIAGNOSIS — F32A Depression, unspecified: Secondary | ICD-10-CM

## 2024-05-01 DIAGNOSIS — Z1321 Encounter for screening for nutritional disorder: Secondary | ICD-10-CM

## 2024-05-01 DIAGNOSIS — Z758 Other problems related to medical facilities and other health care: Secondary | ICD-10-CM

## 2024-05-01 DIAGNOSIS — I10 Essential (primary) hypertension: Secondary | ICD-10-CM

## 2024-05-01 DIAGNOSIS — Z125 Encounter for screening for malignant neoplasm of prostate: Secondary | ICD-10-CM

## 2024-05-01 DIAGNOSIS — F419 Anxiety disorder, unspecified: Secondary | ICD-10-CM

## 2024-05-01 MED ORDER — PANTOPRAZOLE SODIUM 20 MG PO TBEC: 20.0000 mg | DELAYED_RELEASE_TABLET | Freq: Two times a day (BID) | ORAL | 0 refills | Status: AC

## 2024-05-01 MED ORDER — ESCITALOPRAM OXALATE 5 MG PO TABS: 5.0000 mg | ORAL_TABLET | Freq: Every day | ORAL | 0 refills | Status: AC

## 2024-05-01 MED ORDER — HYDROXYZINE HCL 50 MG PO TABS: 50.0000 mg | ORAL_TABLET | Freq: Three times a day (TID) | ORAL | 1 refills | Status: AC | PRN

## 2024-05-01 MED ORDER — ACYCLOVIR 200 MG PO CAPS: ORAL_CAPSULE | ORAL | 11 refills | Status: AC

## 2024-05-01 MED ORDER — AMLODIPINE BESYLATE 10 MG PO TABS: 10.0000 mg | ORAL_TABLET | Freq: Every evening | ORAL | 0 refills | Status: AC

## 2024-05-01 MED ORDER — LISINOPRIL 20 MG PO TABS: 20.0000 mg | ORAL_TABLET | Freq: Every day | ORAL | 0 refills | Status: DC

## 2024-05-01 MED ORDER — ATENOLOL 100 MG PO TABS
100.0000 mg | ORAL_TABLET | Freq: Every day | ORAL | 0 refills | Status: AC
Start: 2024-05-01 — End: ?

## 2024-05-01 NOTE — Progress Notes (Signed)
 Sometimes patient gets too stress, patient need medication refill

## 2024-05-01 NOTE — Progress Notes (Signed)
 Subjective:    Nathan Cherry - 63 y.o. male MRN [id number]  Date of birth: Feb 08, 1961  HPI  Nathan Cherry is to establish care.  Current issues and/or concerns: - High blood pressure. Doing well on Amlodipine , Atenolol , and Lisinopril , no issues/concerns. He does not complain of red flag symptoms such as but not limited to chest pain, shortness of breath, worst headache of life, nausea/vomiting.  - Anxiety depression. States he doesn't feel Hydroxyzine  helping as much as it did before. He denies thoughts of self-harm, suicidal ideations, homicidal ideations. - Acid reflux. Denies red flag symptoms. Doing well on Pantoprazole , no issues/concerns. - Doing well on Acyclovir , no issues/concerns.  - Requests prostate lab.  - Requests vitamin B12 lab.   ROS per HPI     Health Maintenance:  Health Maintenance Due  Topic Date Due   HIV Screening  Never done   Hepatitis C Screening  Never done   Pneumococcal Vaccine: 50+ Years (1 of 1 - PCV) Never done   Zoster Vaccines- Shingrix (1 of 2) Never done   Colonoscopy  08/14/2018   COVID-19 Vaccine (1 - 2024-25 season) Never done   DTaP/Tdap/Td (2 - Td or Tdap) 06/17/2023   INFLUENZA VACCINE  04/11/2024     Past Medical History: Patient Active Problem List   Diagnosis Date Noted   Genital herpes 07/27/2015   Allergy 08/24/2014   Pain in joint, shoulder region 08/24/2014   Gingivitis, chronic 05/28/2014   Neck muscle spasm 02/16/2014   Abdominal pain, other specified site 02/16/2014   Low testosterone  02/16/2014   Migraine 01/05/2014   Anal pruritus 07/07/2013   Generalized anxiety disorder 07/07/2013   Allergic sinusitis 06/16/2013   HTN (hypertension) 06/16/2013      Social History   reports that he has never smoked. He has never used smokeless tobacco. He reports that he does not drink alcohol and does not use drugs.   Family History  family history is not on file.   Medications: reviewed and updated    Objective:   Physical Exam BP 133/81   Pulse 71   Temp 98.2 F (36.8 C) (Oral)   Resp 16   Ht 5' 6 (1.676 m)   Wt 206 lb 3.2 oz (93.5 kg)   SpO2 96%   BMI 33.28 kg/m   Physical Exam HENT:     Head: Normocephalic and atraumatic.     Nose: Nose normal.     Mouth/Throat:     Mouth: Mucous membranes are moist.     Pharynx: Oropharynx is clear.  Eyes:     Extraocular Movements: Extraocular movements intact.     Conjunctiva/sclera: Conjunctivae normal.     Pupils: Pupils are equal, round, and reactive to light.  Cardiovascular:     Rate and Rhythm: Normal rate and regular rhythm.     Pulses: Normal pulses.     Heart sounds: Normal heart sounds.  Pulmonary:     Effort: Pulmonary effort is normal.     Breath sounds: Normal breath sounds.  Musculoskeletal:        General: Normal range of motion.     Cervical back: Normal range of motion and neck supple.  Neurological:     General: No focal deficit present.     Mental Status: He is alert and oriented to person, place, and time.  Psychiatric:        Mood and Affect: Mood normal.        Behavior: Behavior normal.  Assessment & Plan:  1. Encounter to establish care (Primary) - Patient presents today to establish care. During the interim follow-up with primary provider as scheduled.  - Return for annual physical examination, labs, and health maintenance. Arrive fasting meaning having no food for at least 8 hours prior to appointment. You may have only water or black coffee. Please take scheduled medications as normal.  2. Primary hypertension - Continue Amlodipine , Atenolol , and Lisinopril  as prescribed.  - Counseled on blood pressure goal of less than 130/80, low-sodium, DASH diet, medication compliance, and 150 minutes of moderate intensity exercise per week as tolerated. Counseled on medication adherence and adverse effects. - Follow-up with primary provider in 3 months or sooner if needed.  - amLODipine  (NORVASC ) 10 MG  tablet; Take 1 tablet (10 mg total) by mouth every evening.  Dispense: 90 tablet; Refill: 0 - atenolol  (TENORMIN ) 100 MG tablet; Take 1 tablet (100 mg total) by mouth daily.  Dispense: 90 tablet; Refill: 0 - lisinopril  (ZESTRIL ) 20 MG tablet; Take 1 tablet (20 mg total) by mouth daily.  Dispense: 90 tablet; Refill: 0  3. Anxiety and depression - Patient denies thoughts of self-harm, suicidal ideations, homicidal ideations. - Increase Hydroxyzine  from 25 mg to 50 mg as prescribed. Counseled on medication adherence/adverse effects.  - Trial Escitalopram  as prescribed. Counseled on medication adherence/adverse effects.  - Patient declined referral to Psychiatry.  - Follow-up with primary provider in 4 weeks or sooner if needed.  - hydrOXYzine  (ATARAX ) 50 MG tablet; Take 1 tablet (50 mg total) by mouth every 8 (eight) hours as needed.  Dispense: 90 tablet; Refill: 1 - escitalopram  (LEXAPRO ) 5 MG tablet; Take 1 tablet (5 mg total) by mouth daily.  Dispense: 90 tablet; Refill: 0  4. Gastroesophageal reflux disease, unspecified whether esophagitis present - Continue Pantoprazole  as prescribed. Counseled on medication adherence/adverse effects.  - Follow-up with primary provider in 3 months or sooner if needed.  - pantoprazole  (PROTONIX ) 20 MG tablet; Take 1 tablet (20 mg total) by mouth 2 (two) times daily.  Dispense: 180 tablet; Refill: 0  5. Herpes - Continue Acyclovir  as prescribed. Counseled on medication adherence/adverse effects.  - Follow-up with primary provider in 3 months or sooner if needed.  - acyclovir  (ZOVIRAX ) 200 MG capsule; 2 capsules by mouth twice daily  Dispense: 120 capsule; Refill: 11  6. Screening PSA (prostate specific antigen) - Routine screening.  - PSA  7. Encounter for vitamin deficiency screening - Routine screening.  - Vitamin B12  8. Language barrier - [place] in-person interpreter, [place].    Patient was given clear instructions to go to Emergency  Department or return to medical center if symptoms don't improve, worsen, or new problems develop.The patient verbalized understanding.  I discussed the assessment and treatment plan with the patient. The patient was provided an opportunity to ask questions and all were answered. The patient agreed with the plan and demonstrated an understanding of the instructions.   The patient was advised to call back or seek an in-person evaluation if the symptoms worsen or if the condition fails to improve as anticipated.    Greig Drones, NP 05/01/2024, 10:49 AM Primary Care at [facility]

## 2024-05-02 ENCOUNTER — Ambulatory Visit: Payer: Self-pay | Admitting: Family

## 2024-05-02 LAB — PSA: Prostate Specific Ag, Serum: 0.3 ng/mL (ref 0.0–4.0)

## 2024-05-02 LAB — VITAMIN B12: Vitamin B-12: >2000 pg/mL — ABNORMAL HIGH (ref 232–1245)

## 2024-05-14 ENCOUNTER — Ambulatory Visit (INDEPENDENT_AMBULATORY_CARE_PROVIDER_SITE_OTHER): Payer: Self-pay

## 2024-05-14 ENCOUNTER — Telehealth: Payer: Self-pay | Admitting: Family

## 2024-05-14 NOTE — Telephone Encounter (Signed)
 Patient requesting a call regarding a good time to meet with you to discuss OC / Financial application

## 2024-05-15 ENCOUNTER — Telehealth: Payer: Self-pay | Admitting: Emergency Medicine

## 2024-05-15 NOTE — Telephone Encounter (Signed)
 Patient came in requesting acyclovir  (ZOVIRAX ) 200 MG capsule  this prescription to be sent as a 400 MG capsule if possible.

## 2024-05-21 ENCOUNTER — Telehealth: Payer: Self-pay | Admitting: Emergency Medicine

## 2024-05-21 ENCOUNTER — Other Ambulatory Visit: Payer: Self-pay | Admitting: Family

## 2024-05-21 DIAGNOSIS — B009 Herpesviral infection, unspecified: Secondary | ICD-10-CM

## 2024-05-21 MED ORDER — ACYCLOVIR 400 MG PO TABS: 400.0000 mg | ORAL_TABLET | Freq: Two times a day (BID) | ORAL | 0 refills | Status: AC

## 2024-05-21 NOTE — Telephone Encounter (Signed)
 Copied from CRM [id number]. Topic: Clinical - Medication Question >> May 20, 2024  2:17 PM Yolanda T wrote: Reason for CRM: patient called stated [facility] only has acyclovir  (ZOVIRAX ) 400 MG capsule but he would need a new script as the one they recvd was for 200 MG. Please f/u with pharmacy

## 2024-05-21 NOTE — Telephone Encounter (Signed)
 Complete

## 2024-05-30 NOTE — Telephone Encounter (Signed)
 Medication was sent in on 05/21/2024

## 2024-06-25 ENCOUNTER — Encounter (HOSPITAL_COMMUNITY): Payer: Self-pay

## 2024-06-25 ENCOUNTER — Ambulatory Visit: Payer: Self-pay

## 2024-06-25 ENCOUNTER — Ambulatory Visit (HOSPITAL_COMMUNITY)
Admission: EM | Admit: 2024-06-25 | Discharge: 2024-06-25 | Disposition: A | Discharge status: Home / Self Care | Payer: Self-pay

## 2024-06-25 DIAGNOSIS — I10 Essential (primary) hypertension: Secondary | ICD-10-CM

## 2024-06-25 NOTE — Discharge Instructions (Addendum)
 Llame al consultorio de Amy Lorren, enfermera practicante, hoy o maana a primera hora para programar una cita lo antes posible y hablar sobre un cambio de medicamentos para Chief Operating Officer su presin arterial. Por ahora, contine tomando sus medicamentos para la presin arterial segn lo recetado diariamente hasta que pueda ver a su mdico.  Call Amy Lorren NP office today or first thing tomorrow to schedule an appointment as soon as possible to discuss switching medications to manage her blood pressure. For now please continue to take your blood pressure medication as prescribed daily for management of your blood pressure until you are able to get into see them.

## 2024-06-25 NOTE — Telephone Encounter (Signed)
 FYI Only or Action Required?: Action required by provider: update on patient condition.  Patient was last seen in primary care on 05/01/2024 by Lorren Greig PARAS, NP.  Called Nurse Triage reporting Hypertension.  Symptoms began several days ago.  Interventions attempted: Prescription medications: atenolol , lisinopril , amlodipine .  Symptoms are: gradually worsening.  Triage Disposition: Go to ED Now (Notify PCP)  Patient/caregiver understands and will follow disposition?: YesCopied from CRM [id number]. Topic: Clinical - Red Word Triage >> Jun 25, 2024 10:26 AM Pinkey ORN wrote: Red Word that prompted transfer to Nurse Triage: Elevated Blood Pressure >> Jun 25, 2024 10:29 AM Pinkey ORN wrote: Patient states his blood pressure has been elevating and the medication isn't helping. Patient also mentions swelling in his face and knee caps, and pain in his back.  Reason for Disposition  [1] Systolic BP >= 160 OR Diastolic >= 100 AND [2] cardiac (e.g., breathing difficulty, chest pain) or neurologic symptoms (e.g., new-onset blurred or double vision, unsteady gait)  Answer Assessment - Initial Assessment Questions Pt is taking medication but still having elevated BP readings over last 3 days. Unable to sleep last night.  Blurry vision and dizziness currently. RN advised ED due to symptoms. Pt very hesitant about going and wants appt instead. It is unclear if pt will go. CAL notify. Spoke with [place].  Interpretor [id number] used.        1. BLOOD PRESSURE: What is your blood pressure? Did you take at least two measurements 5 minutes apart?     171/82 2. ONSET: When did you take your blood pressure?     now 3. HOW: How did you take your blood pressure? (e.g., automatic home BP monitor, visiting nurse)     Automatic  4. HISTORY: Do you have a history of high blood pressure?     yes 5. MEDICINES: Are you taking any medicines for blood pressure? Have you missed any doses  recently?     Atenolol , amlodipine , lisinopril   6. OTHER SYMPTOMS: Do you have any symptoms? (e.g., blurred vision, chest pain, difficulty breathing, headache, weakness)     Face and knee swelling; pain in back  Protocols used: Blood Pressure - High-A-AH

## 2024-06-25 NOTE — Telephone Encounter (Signed)
 Report to the Emergency Department/Urgent Care/call 911 for immediate medical evaluation. Follow-up with Primary Care.

## 2024-06-25 NOTE — ED Triage Notes (Signed)
 Patient presents to the office hypertension concerns. Patient states he missed one dose of his blood pressure medication yesterday, Patient is not sure which one he missed. Patient states his legs feel irritated. This has been going on for months.

## 2024-06-25 NOTE — Telephone Encounter (Signed)
  FYI Only or Action Required?: Action required by provider: request for appointment.  Patient was last seen in primary care on 05/01/2024 by Lorren Greig PARAS, NP.  Called Nurse Triage reporting No chief complaint on file..  Symptoms began today.  Interventions attempted: Other: at UC today.  Symptoms are: completely resolved.  Triage Disposition: Call PCP When Office is Open  Patient/caregiver understands and will follow disposition?: Yes  Copied from CRM [id number]. Topic: Clinical - Red Word Triage >> Jun 25, 2024  4:57 PM Joesph NOVAK wrote: Red Word that prompted transfer to Nurse Triage: High BP. Side affects to medication , body shakes, swelling, irrirtation Reason for Disposition  [1] Caller requesting NON-URGENT health information AND [2] PCP's office is the best resource  Answer Assessment - Initial Assessment Questions 1. REASON FOR CALL: What is the main reason for your call? or How can I best help you?     Called to schedule appointment only.  Asymptomatic at time of call 2. SYMPTOMS : Do you have any symptoms?    Earlier today,  Body irritation, shaking, face and foot swelling at times 3. OTHER QUESTIONS: Do you have any other questions?     N/a  Given instruction to proceed to ED for elevated blood pressure, chest pain, SOB, any numbness or weakness to one side of face or body. Patient and son verbalize understanding  Protocols used: Information Only Call - No Triage-A-AH

## 2024-06-25 NOTE — ED Provider Notes (Signed)
 [facility]    CSN: [id number] Arrival date & time: 06/25/24  1423      History   Chief Complaint Chief Complaint  Patient presents with   Hypertension   Rash    HPI Nathan Cherry is a 63 y.o. male.   Patient presents with concerns for current management of his blood pressure.  Patient states that his blood pressure has been on and off high over the last few days.  Patient is prescribed lisinopril , atenolol , and amlodipine  for his blood pressure.  Patient states that he has not been taking all 3 of these every day as prescribed because he is concerned that he may be having side effects from a few of them.  Patient states that when he takes the lisinopril  he does not have any side effects, but he thinks that he is having side effects when he takes the amlodipine  and atenolol .  Patient states that when he takes these he has intermittent episodes of shaking and some irritation to his lower legs.    Patient states he has also noticed some mild swelling to his lower legs a few times over the last few weeks, but denies any swelling at this time.  Patient denies any rash to his lower legs, and just states that they feel itchy and irritated.  Patient does have a primary care provider, but states that he does not have an appointment scheduled with them until January.  Patient denies chest pain, shortness of breath, dizziness, blurry vision, headache, nausea, vomiting, confusion, slurred speech, weakness, and facial droop.  The history is provided by the patient and medical records. The history is limited by a language barrier. No language interpreter was used (Declines interpreter, patient speaks [place] and son is interpreting if needed.).  Hypertension  Rash   Past Medical History:  Diagnosis Date   Allergy    Anxiety    History of anger issues related to this.     BPH without obstruction/lower urinary tract symptoms    Has had in past--but has been asymptomatic in  2016   External hemorrhoids    Genital herpes    Herpes    Hyperlipidemia    Hypertension    IBS (irritable bowel syndrome)    Associated with anxiety   Low testosterone     PUD (peptic ulcer disease) 2014    Patient Active Problem List   Diagnosis Date Noted   Genital herpes 07/27/2015   Allergy 08/24/2014   Pain in joint, shoulder region 08/24/2014   Gingivitis, chronic 05/28/2014   Neck muscle spasm 02/16/2014   Abdominal pain, other specified site 02/16/2014   Low testosterone  02/16/2014   Migraine 01/05/2014   Anal pruritus 07/07/2013   Generalized anxiety disorder 07/07/2013   Allergic sinusitis 06/16/2013   HTN (hypertension) 06/16/2013    History reviewed. No pertinent surgical history.     Home Medications    Prior to Admission medications   Medication Sig Start Date End Date Taking? Authorizing Provider  acyclovir  (ZOVIRAX ) 200 MG capsule 2 capsules by mouth twice daily 05/01/24  Yes Lorren, Amy J, NP  acyclovir  (ZOVIRAX ) 400 MG tablet Take 1 tablet (400 mg total) by mouth 2 (two) times daily. 05/21/24  Yes Lorren, Amy J, NP  amLODipine  (NORVASC ) 10 MG tablet Take 1 tablet (10 mg total) by mouth every evening. 05/01/24  Yes Lorren, Amy J, NP  atenolol  (TENORMIN ) 100 MG tablet Take 1 tablet (100 mg total) by mouth daily. 05/01/24  Yes Lorren,  Amy J, NP  Cyanocobalamin (VITAMIN B-12 PO) Take 1 capsule by mouth daily.   Yes [provider]  dicyclomine  (BENTYL ) 20 MG tablet Take 1 tablet (20 mg total) by mouth 4 (four) times daily as needed (intestinal cramps). 10/20/22  Yes Vonna Sharlet POUR, MD  escitalopram  (LEXAPRO ) 5 MG tablet Take 1 tablet (5 mg total) by mouth daily. 05/01/24  Yes Lorren, Amy J, NP  hydrOXYzine  (ATARAX ) 50 MG tablet Take 1 tablet (50 mg total) by mouth every 8 (eight) hours as needed. 05/01/24  Yes Lorren, Amy J, NP  lisinopril  (ZESTRIL ) 20 MG tablet Take 1 tablet (20 mg total) by mouth daily. 05/01/24  Yes Lorren, Amy J, NP   pantoprazole  (PROTONIX ) 20 MG tablet Take 1 tablet (20 mg total) by mouth 2 (two) times daily. 05/01/24 07/30/24 Yes Lorren, Amy J, NP  DEPO-TESTOSTERONE  200 MG/ML injection INJECT 1 ML (200 MG TOTAL) INTO THE MUSCLE EVERY 28 DAYS Patient not taking: Reported on 06/18/2018 06/14/16 02/11/20  Jegede, Olugbemiga E, MD  gemfibrozil  (LOPID ) 600 MG tablet TAKE ONE TABLET BY MOUTH TWICE DAILY WITH MEALS Patient not taking: Reported on 06/18/2018 07/18/16 02/11/20  Adella Norris, MD  metoprolol  tartrate (LOPRESSOR ) 25 MG tablet TAKE ONE TABLET BY MOUTH TWICE DAILY WITH MEALS Patient not taking: Reported on 06/18/2018 07/18/16 02/11/20  Adella Norris, MD  sertraline (ZOLOFT) 50 MG tablet TAKE ONE-HALF TABLET BY MOUTH ONCE DAILY BEFORE MEAL(S) Patient not taking: Reported on 06/18/2018 07/18/16 02/11/20  Adella Norris, MD    Family History History reviewed. No pertinent family history.  Social History Social History   Tobacco Use   Smoking status: Never   Smokeless tobacco: Never  Vaping Use   Vaping status: Never Used  Substance Use Topics   Alcohol use: No    Alcohol/week: 0.0 standard drinks of alcohol    Comment: No alcohol since 2005.  History of drinking too much.   Drug use: No     Allergies   Doxycycline   Review of Systems Review of Systems  Skin:  Positive for rash.   Per HPI  Physical Exam Triage Vital Signs ED Triage Vitals  Encounter Vitals Group     BP 06/25/24 1516 (!) 146/76     Girls Systolic BP Percentile --      Girls Diastolic BP Percentile --      Boys Systolic BP Percentile --      Boys Diastolic BP Percentile --      Pulse Rate 06/25/24 1516 76     Resp 06/25/24 1516 18     Temp 06/25/24 1516 98.6 F (37 C)     Temp Source 06/25/24 1516 Oral     SpO2 06/25/24 1516 93 %     Weight --      Height --      Head Circumference --      Peak Flow --      Pain Score 06/25/24 1523 0     Pain Loc --      Pain Education --      Exclude from Growth  Chart --    No data found.  Updated Vital Signs BP (!) 146/76   Pulse 76   Temp 98.6 F (37 C) (Oral)   Resp 18   SpO2 93%   Visual Acuity Right Eye Distance:   Left Eye Distance:   Bilateral Distance:    Right Eye Near:   Left Eye Near:    Bilateral Near:  Physical Exam Vitals and nursing note reviewed.  Constitutional:      General: He is awake. He is not in acute distress.    Appearance: Normal appearance. He is well-developed and well-groomed. He is not ill-appearing.  Cardiovascular:     Rate and Rhythm: Normal rate and regular rhythm.     Heart sounds: Normal heart sounds.  Pulmonary:     Effort: Pulmonary effort is normal.     Breath sounds: Normal breath sounds.  Musculoskeletal:     Right lower leg: No edema.     Left lower leg: No edema.  Skin:    General: Skin is warm and dry.     Findings: No rash.  Neurological:     General: No focal deficit present.     Mental Status: He is alert and oriented to person, place, and time. Mental status is at baseline.     GCS: GCS eye subscore is 4. GCS verbal subscore is 5. GCS motor subscore is 6.     Cranial Nerves: Cranial nerves 2-12 are intact.     Sensory: Sensation is intact.     Motor: Motor function is intact.     Coordination: Coordination is intact.     Gait: Gait is intact.  Psychiatric:        Behavior: Behavior is cooperative.      UC Treatments / Results  Labs (all labs ordered are listed, but only abnormal results are displayed) Labs Reviewed - No data to display  EKG   Radiology No results found.  Procedures Procedures (including critical care time)  Medications Ordered in UC Medications - No data to display  Initial Impression / Assessment and Plan / UC Course  I have reviewed the triage vital signs and the nursing notes.  Pertinent labs & imaging results that were available during my care of the patient were reviewed by me and considered in my medical decision making (see  chart for details).     Patient is overall well-appearing.  Vitals are stable.  Blood pressure is mildly elevated at 146/76.  No significant findings on exam.  Heart and lung sounds normal.  No signs of peripheral edema on exam.  No neurodeficits noted.  GCS 15.  Recommended patient continue to take blood pressure medications as prescribed at this time and schedule an appointment with PCP as soon as possible for further evaluation and management due to concerns for possible side effects related to medications.  Patient is understanding and agreeable to plan at this time.  Discussed follow-up, return, and strict ER precautions. Final Clinical Impressions(s) / UC Diagnoses   Final diagnoses:  Essential hypertension     Discharge Instructions      Llame al consultorio de Amy Lorren, enfermera practicante, hoy o maana a primera hora para programar una cita lo antes posible y hablar sobre un cambio de medicamentos para Chief Operating Officer su presin arterial. Por ahora, contine tomando sus medicamentos para la presin arterial segn lo recetado diariamente hasta que pueda ver a su mdico.  Call Greig Lorren NP office today or first thing tomorrow to schedule an appointment as soon as possible to discuss switching medications to manage her blood pressure. For now please continue to take your blood pressure medication as prescribed daily for management of your blood pressure until you are able to get into see them.   ED Prescriptions   None    PDMP not reviewed this encounter.   Johnie Flaming A, NP 06/25/24 1606

## 2024-06-26 NOTE — Telephone Encounter (Signed)
 I called patient with interpreter Tobias [phone] and I made patient aware of pcp recommendations and patient stated he has an appointment on 10/30 and he went to a clinic and his b/p was ok

## 2024-08-25 ENCOUNTER — Ambulatory Visit: Payer: Self-pay | Admitting: Family

## 2024-08-25 ENCOUNTER — Encounter: Payer: Self-pay | Admitting: Family

## 2024-08-25 VITALS — BP 148/88 | HR 84 | Temp 99.4°F | Resp 16 | Ht 66.0 in | Wt 212.8 lb

## 2024-08-25 DIAGNOSIS — Z13228 Encounter for screening for other metabolic disorders: Secondary | ICD-10-CM

## 2024-08-25 DIAGNOSIS — R21 Rash and other nonspecific skin eruption: Secondary | ICD-10-CM

## 2024-08-25 DIAGNOSIS — Z758 Other problems related to medical facilities and other health care: Secondary | ICD-10-CM

## 2024-08-25 DIAGNOSIS — Z603 Acculturation difficulty: Secondary | ICD-10-CM

## 2024-08-25 DIAGNOSIS — I10 Essential (primary) hypertension: Secondary | ICD-10-CM

## 2024-08-25 DIAGNOSIS — H5712 Ocular pain, left eye: Secondary | ICD-10-CM

## 2024-08-25 MED ORDER — TRIAMCINOLONE ACETONIDE 0.025 % EX CREA: 1.0000 | TOPICAL_CREAM | Freq: Two times a day (BID) | CUTANEOUS | 2 refills | Status: AC

## 2024-08-25 MED ORDER — ERYTHROMYCIN 5 MG/GM OP OINT: 1.0000 | TOPICAL_OINTMENT | Freq: Every day | OPHTHALMIC | 1 refills | Status: AC

## 2024-08-25 MED ORDER — LISINOPRIL 30 MG PO TABS: 30.0000 mg | ORAL_TABLET | Freq: Every day | ORAL | 0 refills | Status: DC

## 2024-08-25 NOTE — Progress Notes (Signed)
 Patient ID: Nathan Cherry, male    DOB: 03-Jun-1961  MRN: [id number]  CC: Chronic Conditions Follow-Up  Subjective: Nathan Cherry is a 63 y.o. male who presents for chronic conditions follow-up.  His concerns today include:  - Doing well on Lisinopril , no issues/concerns. States Amlodipine  and Atenolol  makes him dizzy. The patient does not complain of red flag symptoms such as but not limited to chest pain, shortness of breath, worst headache of life, nausea/vomiting.  - Left eye pain and redness. Denies red flag symptoms.  - Rash of neck itching. Denies red flag symptoms.  - Testosterone  lab.   Patient Active Problem List   Diagnosis Date Noted   Genital herpes 07/27/2015   Allergy 08/24/2014   Pain in joint, shoulder region 08/24/2014   Gingivitis, chronic 05/28/2014   Neck muscle spasm 02/16/2014   Abdominal pain, other specified site 02/16/2014   Low testosterone  02/16/2014   Migraine 01/05/2014   Anal pruritus 07/07/2013   Generalized anxiety disorder 07/07/2013   Allergic sinusitis 06/16/2013   HTN (hypertension) 06/16/2013     Medications Ordered Prior to Encounter[1]  Allergies[2]  Social History   Socioeconomic History   Marital status: Single    Spouse name: never married   Number of children: 3   Years of education: 9   Highest education level: Not on file  Occupational History   Occupation: Research scientist (medical)  Tobacco Use   Smoking status: Never   Smokeless tobacco: Never  Vaping Use   Vaping status: Never Used  Substance and Sexual Activity   Alcohol use: No    Alcohol/week: 0.0 standard drinks of alcohol    Comment: No alcohol since 2005.  History of drinking too much.   Drug use: No   Sexual activity: Yes    Birth control/protection: Condom  Other Topics Concern   Not on file  Social History Narrative   Lives at home with 3 children   Does have a girlfriend--do not live together.     Originally from [place]   Came to [employer].  In 1983   Social Drivers of Health   Tobacco Use: Low Risk (08/25/2024)   Patient History    Smoking Tobacco Use: Never    Smokeless Tobacco Use: Never    Passive Exposure: Not on file  Financial Resource Strain: Low Risk (05/01/2024)   Overall Financial Resource Strain (CARDIA)    Difficulty of Paying Living Expenses: Not hard at all  Food Insecurity: No Food Insecurity (05/01/2024)   Epic    Worried About Radiation Protection Practitioner of Food in the Last Year: Never true    Ran Out of Food in the Last Year: Never true  Transportation Needs: No Transportation Needs (05/01/2024)   Epic    Lack of Transportation (Medical): No    Lack of Transportation (Non-Medical): No  Physical Activity: Sufficiently Active (05/01/2024)   Exercise Vital Sign    Days of Exercise per Week: 7 days    Minutes of Exercise per Session: 30 min  Stress: No Stress Concern Present (05/01/2024)   [employer] of Occupational Health - Occupational Stress Questionnaire    Feeling of Stress: Not at all  Social Connections: Socially Isolated (05/01/2024)   Social Connection and Isolation Panel    Frequency of Communication with Friends and Family: More than three times a week    Frequency of Social Gatherings with Friends and Family: Once a week    Attends Religious Services: Never    Active Member  of Clubs or Organizations: No    Attends Banker Meetings: Never    Marital Status: Separated  Intimate Partner Violence: Not At Risk (05/01/2024)   Epic    Fear of Current or Ex-Partner: No    Emotionally Abused: No    Physically Abused: No    Sexually Abused: No  Depression (PHQ2-9): Low Risk (08/25/2024)   Depression (PHQ2-9)    PHQ-2 Score: 0  Alcohol Screen: Low Risk (05/01/2024)   Alcohol Screen    Last Alcohol Screening Score (AUDIT): 0  Housing: Unknown (05/01/2024)   Epic    Unable to Pay for Housing in the Last Year: No    Number of Times Moved in the Last Year: Not on file    Homeless in the Last  Year: No  Utilities: Not At Risk (05/01/2024)   Epic    Threatened with loss of utilities: No  Health Literacy: Inadequate Health Literacy (05/01/2024)   B1300 Health Literacy    Frequency of need for help with medical instructions: Always    History reviewed. No pertinent family history.  History reviewed. No pertinent surgical history.  ROS: Review of Systems Negative except as stated above  PHYSICAL EXAM: BP (!) 148/88   Pulse 84   Temp 99.4 F (37.4 C) (Oral)   Resp 16   Ht 5' 6 (1.676 m)   Wt 212 lb 12.8 oz (96.5 kg)   SpO2 95%   BMI 34.35 kg/m   Physical Exam HENT:     Head: Normocephalic and atraumatic.     Nose: Nose normal.     Mouth/Throat:     Mouth: Mucous membranes are moist.     Pharynx: Oropharynx is clear.  Eyes:     General: Lids are normal. Vision grossly intact. Gaze aligned appropriately.     Extraocular Movements: Extraocular movements intact.     Conjunctiva/sclera: Conjunctivae normal.     Pupils: Pupils are equal, round, and reactive to light.     Comments: Left eye redness no drainage.   Cardiovascular:     Rate and Rhythm: Normal rate and regular rhythm.     Pulses: Normal pulses.     Heart sounds: Normal heart sounds.  Pulmonary:     Effort: Pulmonary effort is normal.     Breath sounds: Normal breath sounds.  Musculoskeletal:        General: Normal range of motion.     Cervical back: Normal range of motion and neck supple.  Skin:    General: Skin is warm and dry.     Findings: Rash present.  Neurological:     General: No focal deficit present.     Mental Status: He is alert and oriented to person, place, and time.  Psychiatric:        Mood and Affect: Mood normal.        Behavior: Behavior normal.     ASSESSMENT AND PLAN: 1. Primary hypertension (Primary) - Blood pressure not at goal during today's visit. Patient asymptomatic without chest pressure, chest pain, palpitations, shortness of breath, worst headache of life, and  any additional red flag symptoms. - Amlodipine  and Atenolol  discontinued due to side effects of dizziness. - Increase Lisinopril  from 20 mg to 30 mg as prescribed.  - Routine screening.  - Counseled on blood pressure goal of less than 130/80, low-sodium, DASH diet, medication compliance, and 150 minutes of moderate intensity exercise per week as tolerated. Counseled on medication adherence and adverse effects. - Follow-up  with primary provider in 4 weeks or sooner if needed.  - lisinopril  (ZESTRIL ) 30 MG tablet; Take 1 tablet (30 mg total) by mouth daily.  Dispense: 90 tablet; Refill: 0 - Basic Metabolic Panel  2. Pain of left eye - Erythromycin  Ophthalmic Ointment as prescribed. Counseled on medication adherence/adverse effects.  - Referral to Ophthalmology for evaluation/management.  - Follow-up with primary provider as scheduled.  - erythromycin  ophthalmic ointment; Place 1 Application into the left eye at bedtime.  Dispense: 7 g; Refill: 1 - Ambulatory referral to Ophthalmology  3. Rash and nonspecific skin eruption - Triamcinolone  as prescribed. Counseled on medication adherence/adverse effects.  - Follow-up with primary provider as scheduled.  - triamcinolone  (KENALOG ) 0.025 % cream; Apply 1 Application topically 2 (two) times daily.  Dispense: 60 g; Refill: 2  4. Screening for metabolic disorder - Routine screening.  - Testosterone   5. Language barrier - [employer]. ID#: [id number]    Patient was given the opportunity to ask questions.  Patient verbalized understanding of the plan and was able to repeat key elements of the plan. Patient was given clear instructions to go to Emergency Department or return to medical center if symptoms don't improve, worsen, or new problems develop.The patient verbalized understanding.   Orders Placed This Encounter  Procedures   Basic Metabolic Panel   Testosterone    Ambulatory referral to Ophthalmology     Requested  Prescriptions   Signed Prescriptions Disp Refills   lisinopril  (ZESTRIL ) 30 MG tablet 90 tablet 0    Sig: Take 1 tablet (30 mg total) by mouth daily.   erythromycin  ophthalmic ointment 7 g 1    Sig: Place 1 Application into the left eye at bedtime.   triamcinolone  (KENALOG ) 0.025 % cream 60 g 2    Sig: Apply 1 Application topically 2 (two) times daily.    Return in about 3 months (around 11/23/2024) for Follow-Up or next available chronic conditions.  Greig JINNY Chute, NP      [1]  Current Outpatient Medications on File Prior to Visit  Medication Sig Dispense Refill   acyclovir  (ZOVIRAX ) 200 MG capsule 2 capsules by mouth twice daily 120 capsule 11   acyclovir  (ZOVIRAX ) 400 MG tablet Take 1 tablet (400 mg total) by mouth 2 (two) times daily. 180 tablet 0   amLODipine  (NORVASC ) 10 MG tablet Take 1 tablet (10 mg total) by mouth every evening. 90 tablet 0   atenolol  (TENORMIN ) 100 MG tablet Take 1 tablet (100 mg total) by mouth daily. 90 tablet 0   Cyanocobalamin (VITAMIN B-12 PO) Take 1 capsule by mouth daily.     dicyclomine  (BENTYL ) 20 MG tablet Take 1 tablet (20 mg total) by mouth 4 (four) times daily as needed (intestinal cramps). 40 tablet 0   escitalopram  (LEXAPRO ) 5 MG tablet Take 1 tablet (5 mg total) by mouth daily. 90 tablet 0   hydrOXYzine  (ATARAX ) 50 MG tablet Take 1 tablet (50 mg total) by mouth every 8 (eight) hours as needed. 90 tablet 1   pantoprazole  (PROTONIX ) 20 MG tablet Take 1 tablet (20 mg total) by mouth 2 (two) times daily. 180 tablet 0   [DISCONTINUED] DEPO-TESTOSTERONE  200 MG/ML injection INJECT 1 ML (200 MG TOTAL) INTO THE MUSCLE EVERY 28 DAYS (Patient not taking: Reported on 06/18/2018) 10 mL 0   [DISCONTINUED] gemfibrozil  (LOPID ) 600 MG tablet TAKE ONE TABLET BY MOUTH TWICE DAILY WITH MEALS (Patient not taking: Reported on 06/18/2018) 60 tablet 2   [DISCONTINUED] metoprolol  tartrate (LOPRESSOR )  25 MG tablet TAKE ONE TABLET BY MOUTH TWICE DAILY WITH MEALS (Patient  not taking: Reported on 06/18/2018) 60 tablet 2   [DISCONTINUED] sertraline (ZOLOFT) 50 MG tablet TAKE ONE-HALF TABLET BY MOUTH ONCE DAILY BEFORE MEAL(S) (Patient not taking: Reported on 06/18/2018) 15 tablet 2   No current facility-administered medications on file prior to visit.  [2]  Allergies Allergen Reactions   Doxycycline Itching

## 2024-08-25 NOTE — Progress Notes (Signed)
 Follow up patient wants b/p medication change,  patient left is red and is burning,  patient wants his Testerone check, patient has a rash around his neck sometimes

## 2024-08-26 ENCOUNTER — Ambulatory Visit: Payer: Self-pay | Admitting: Family

## 2024-08-26 DIAGNOSIS — R7989 Other specified abnormal findings of blood chemistry: Secondary | ICD-10-CM

## 2024-08-26 LAB — BASIC METABOLIC PANEL WITH GFR
BUN/Creatinine Ratio: 7 — ABNORMAL LOW (ref 10–24)
BUN: 9 mg/dL (ref 8–27)
CO2: 23 mmol/L (ref 20–29)
Calcium: 9.3 mg/dL (ref 8.6–10.2)
Chloride: 100 mmol/L (ref 96–106)
Creatinine, Ser: 1.22 mg/dL (ref 0.76–1.27)
Glucose: 144 mg/dL — ABNORMAL HIGH (ref 70–99)
Potassium: 4.1 mmol/L (ref 3.5–5.2)
Sodium: 140 mmol/L (ref 134–144)
eGFR: 66 mL/min/1.73 (ref >59)

## 2024-08-26 LAB — TESTOSTERONE: Testosterone: 179 ng/dL — ABNORMAL LOW (ref 264–916)

## 2024-09-15 ENCOUNTER — Encounter: Payer: Self-pay | Admitting: Family

## 2024-09-15 ENCOUNTER — Ambulatory Visit: Payer: Self-pay

## 2024-09-15 ENCOUNTER — Ambulatory Visit: Payer: Self-pay | Admitting: Family

## 2024-09-15 VITALS — BP 162/89 | HR 65 | Temp 98.4°F | Resp 16 | Ht 66.0 in | Wt 212.0 lb

## 2024-09-15 DIAGNOSIS — Z130 Encounter for screening for diseases of the blood and blood-forming organs and certain disorders involving the immune mechanism: Secondary | ICD-10-CM

## 2024-09-15 DIAGNOSIS — Z0000 Encounter for general adult medical examination without abnormal findings: Secondary | ICD-10-CM

## 2024-09-15 DIAGNOSIS — G43909 Migraine, unspecified, not intractable, without status migrainosus: Secondary | ICD-10-CM

## 2024-09-15 DIAGNOSIS — Z114 Encounter for screening for human immunodeficiency virus [HIV]: Secondary | ICD-10-CM

## 2024-09-15 DIAGNOSIS — Z0001 Encounter for general adult medical examination with abnormal findings: Secondary | ICD-10-CM

## 2024-09-15 DIAGNOSIS — Z1211 Encounter for screening for malignant neoplasm of colon: Secondary | ICD-10-CM

## 2024-09-15 DIAGNOSIS — Z1159 Encounter for screening for other viral diseases: Secondary | ICD-10-CM

## 2024-09-15 DIAGNOSIS — Z131 Encounter for screening for diabetes mellitus: Secondary | ICD-10-CM

## 2024-09-15 DIAGNOSIS — Z13228 Encounter for screening for other metabolic disorders: Secondary | ICD-10-CM

## 2024-09-15 DIAGNOSIS — Z13220 Encounter for screening for lipoid disorders: Secondary | ICD-10-CM

## 2024-09-15 DIAGNOSIS — Z1329 Encounter for screening for other suspected endocrine disorder: Secondary | ICD-10-CM

## 2024-09-15 DIAGNOSIS — I10 Essential (primary) hypertension: Secondary | ICD-10-CM

## 2024-09-15 DIAGNOSIS — Z603 Acculturation difficulty: Secondary | ICD-10-CM

## 2024-09-15 MED ORDER — SUMATRIPTAN SUCCINATE 50 MG PO TABS: ORAL_TABLET | ORAL | 1 refills | Status: DC

## 2024-09-15 MED ORDER — LISINOPRIL 40 MG PO TABS: 40.0000 mg | ORAL_TABLET | Freq: Every day | ORAL | 0 refills | Status: AC

## 2024-09-15 MED ORDER — SUMATRIPTAN SUCCINATE 25 MG PO TABS: ORAL_TABLET | ORAL | 0 refills | Status: AC

## 2024-09-15 NOTE — Progress Notes (Signed)
"   migraine         "

## 2024-09-15 NOTE — Telephone Encounter (Signed)
 New prescription complete.

## 2024-09-15 NOTE — Telephone Encounter (Signed)
 Copied from CRM [id number]. Topic: Clinical - Medication Question >> Sep 15, 2024  3:04 PM Delon HERO wrote: Reason for CRM: Steffan calling from [facility] is calling to report that SUMAtriptan  (IMITREX ) 50 MG tablet [[id number]] is not sig states 25mg . Pharmacy is requesting clairity. Please advise

## 2024-09-15 NOTE — Progress Notes (Signed)
 "   Patient ID: Nathan Cherry, male    DOB: 12-Aug-1961  MRN: [id number]  CC: Annual Exam  Subjective: Nathan Cherry is a 64 y.o. male who presents for annual exam.   His concerns today include:  - Due for colon cancer screening.  - Doing well on Lisinopril , no issues/concerns. Home blood pressure above goal. He does not complain of red flag symptoms such as but not limited to chest pain, shortness of breath, nausea/vomiting.  - Headaches. Denies red flag symptoms. Taking over-the-counter medication with minimal relief.   Patient Active Problem List   Diagnosis Date Noted   Genital herpes 07/27/2015   Allergy 08/24/2014   Pain in joint, shoulder region 08/24/2014   Gingivitis, chronic 05/28/2014   Neck muscle spasm 02/16/2014   Abdominal pain, other specified site 02/16/2014   Low testosterone  02/16/2014   Migraine 01/05/2014   Anal pruritus 07/07/2013   Generalized anxiety disorder 07/07/2013   Allergic sinusitis 06/16/2013   HTN (hypertension) 06/16/2013     Medications Ordered Prior to Encounter[1]  Allergies[2]  Social History   Socioeconomic History   Marital status: Single    Spouse name: never married   Number of children: 3   Years of education: 9   Highest education level: Not on file  Occupational History   Occupation: Research scientist (medical)  Tobacco Use   Smoking status: Never   Smokeless tobacco: Never  Vaping Use   Vaping status: Never Used  Substance and Sexual Activity   Alcohol use: No    Alcohol/week: 0.0 standard drinks of alcohol    Comment: No alcohol since 2005.  History of drinking too much.   Drug use: No   Sexual activity: Yes    Birth control/protection: Condom  Other Topics Concern   Not on file  Social History Narrative   Lives at home with 3 children   Does have a girlfriend--do not live together.     Originally from [place]   Came to [employer]. In 1983   Social Drivers of Health   Tobacco Use: Low Risk (09/15/2024)    Patient History    Smoking Tobacco Use: Never    Smokeless Tobacco Use: Never    Passive Exposure: Not on file  Financial Resource Strain: Low Risk (05/01/2024)   Overall Financial Resource Strain (CARDIA)    Difficulty of Paying Living Expenses: Not hard at all  Food Insecurity: No Food Insecurity (05/01/2024)   Epic    Worried About Radiation Protection Practitioner of Food in the Last Year: Never true    Ran Out of Food in the Last Year: Never true  Transportation Needs: No Transportation Needs (05/01/2024)   Epic    Lack of Transportation (Medical): No    Lack of Transportation (Non-Medical): No  Physical Activity: Sufficiently Active (05/01/2024)   Exercise Vital Sign    Days of Exercise per Week: 7 days    Minutes of Exercise per Session: 30 min  Stress: No Stress Concern Present (05/01/2024)   [employer] of Occupational Health - Occupational Stress Questionnaire    Feeling of Stress: Not at all  Social Connections: Socially Isolated (05/01/2024)   Social Connection and Isolation Panel    Frequency of Communication with Friends and Family: More than three times a week    Frequency of Social Gatherings with Friends and Family: Once a week    Attends Religious Services: Never    Database Administrator or Organizations: No    Attends Banker  Meetings: Never    Marital Status: Separated  Intimate Partner Violence: Not At Risk (05/01/2024)   Epic    Fear of Current or Ex-Partner: No    Emotionally Abused: No    Physically Abused: No    Sexually Abused: No  Depression (PHQ2-9): Low Risk (09/15/2024)   Depression (PHQ2-9)    PHQ-2 Score: 0  Alcohol Screen: Low Risk (05/01/2024)   Alcohol Screen    Last Alcohol Screening Score (AUDIT): 0  Housing: Unknown (05/01/2024)   Epic    Unable to Pay for Housing in the Last Year: No    Number of Times Moved in the Last Year: Not on file    Homeless in the Last Year: No  Utilities: Not At Risk (05/01/2024)   Epic    Threatened with loss of  utilities: No  Health Literacy: Inadequate Health Literacy (05/01/2024)   B1300 Health Literacy    Frequency of need for help with medical instructions: Always    History reviewed. No pertinent family history.  History reviewed. No pertinent surgical history.  ROS: Review of Systems Negative except as stated above  PHYSICAL EXAM: BP (!) 162/89   Pulse 65   Temp 98.4 F (36.9 C) (Oral)   Resp 16   Ht 5' 6 (1.676 m)   Wt 212 lb (96.2 kg)   SpO2 96%   BMI 34.22 kg/m      09/15/2024    1:34 PM 09/15/2024    1:19 PM 08/25/2024    3:42 PM  Vitals with BMI  Height  5' 6   Weight  212 lbs   BMI  34.23   Systolic 162 149 851  Diastolic 89 89 88  Pulse  65    Physical Exam HENT:     Head: Normocephalic and atraumatic.     Right Ear: Tympanic membrane, ear canal and external ear normal.     Left Ear: Tympanic membrane, ear canal and external ear normal.     Nose: Nose normal.     Mouth/Throat:     Mouth: Mucous membranes are moist.     Pharynx: Oropharynx is clear.  Eyes:     Extraocular Movements: Extraocular movements intact.     Conjunctiva/sclera: Conjunctivae normal.     Pupils: Pupils are equal, round, and reactive to light.  Neck:     Thyroid : No thyroid  mass, thyromegaly or thyroid  tenderness.  Cardiovascular:     Rate and Rhythm: Normal rate and regular rhythm.     Pulses: Normal pulses.     Heart sounds: Normal heart sounds.  Pulmonary:     Effort: Pulmonary effort is normal.     Breath sounds: Normal breath sounds.  Abdominal:     General: Bowel sounds are normal.     Palpations: Abdomen is soft.  Genitourinary:    Comments: Patient declined. Musculoskeletal:        General: Normal range of motion.     Right shoulder: Normal.     Left shoulder: Normal.     Right upper arm: Normal.     Left upper arm: Normal.     Right elbow: Normal.     Left elbow: Normal.     Right forearm: Normal.     Left forearm: Normal.     Right wrist: Normal.     Left  wrist: Normal.     Right hand: Normal.     Left hand: Normal.     Cervical back: Normal, normal range of motion  and neck supple.     Thoracic back: Normal.     Lumbar back: Normal.     Right hip: Normal.     Left hip: Normal.     Right upper leg: Normal.     Left upper leg: Normal.     Right knee: Normal.     Left knee: Normal.     Right lower leg: Normal.     Left lower leg: Normal.     Right ankle: Normal.     Left ankle: Normal.     Right foot: Normal.     Left foot: Normal.  Skin:    General: Skin is warm and dry.     Capillary Refill: Capillary refill takes less than 2 seconds.  Neurological:     General: No focal deficit present.     Mental Status: He is alert and oriented to person, place, and time.  Psychiatric:        Mood and Affect: Mood normal.        Behavior: Behavior normal.     ASSESSMENT AND PLAN: 1. Annual physical exam (Primary) - Counseled on 150 minutes of exercise per week as tolerated, healthy eating (including decreased daily intake of saturated fats, cholesterol, added sugars, sodium), STI prevention, and routine healthcare maintenance.  2. Screening for metabolic disorder - Routine screening.  - CMP14+EGFR  3. Screening for deficiency anemia - Routine screening.  - CBC  4. Diabetes mellitus screening - Routine screening.  - Hemoglobin A1c  5. Screening cholesterol level - Routine screening.  - Lipid panel  6. Thyroid  disorder screen - Routine screening.  - TSH  7. Encounter for screening for HIV - Routine screening.  - HIV antibody (with reflex)  8. Need for hepatitis C screening test - Routine screening.  - Hepatitis C Antibody  9. Colon cancer screening - Referral to Gastroenterology for colon cancer screening by colonoscopy. - Ambulatory referral to Gastroenterology  10. Primary hypertension - Blood pressure not at goal during today's visit. Patient asymptomatic without chest pressure, chest pain, palpitations, shortness  of breath, worst headache of life, and any additional red flag symptoms. - Increase Lisinopril  from 30 mg to 40 mg as prescribed.  - Counseled on blood pressure goal of less than 130/80, low-sodium, DASH diet, medication compliance, and 150 minutes of moderate intensity exercise per week as tolerated. Counseled on medication adherence and adverse effects. - Follow-up with primary provider in 4 weeks or sooner if needed. - lisinopril  (ZESTRIL ) 40 MG tablet; Take 1 tablet (40 mg total) by mouth daily.  Dispense: 90 tablet; Refill: 0  11. Migraine without status migrainosus, not intractable, unspecified migraine type - Sumatriptan  as prescribed. Counseled on medication adherence/adverse effects.  - Follow-up with primary provider in 4 weeks or sooner if needed.  - SUMAtriptan  (IMITREX ) 50 MG tablet; Take 25 mg (1 tablet total) by mouth at the start of the headache. May repeat in 2 hours x 1 if headache persists. Max of 2 tablets/24 hours.  Dispense: 90 tablet; Refill: 1  12. Language barrier - [employer]. ID#: [id number]   Patient was given the opportunity to ask questions.  Patient verbalized understanding of the plan and was able to repeat key elements of the plan. Patient was given clear instructions to go to Emergency Department or return to medical center if symptoms don't improve, worsen, or new problems develop.The patient verbalized understanding.   Orders Placed This Encounter  Procedures   HIV antibody (with reflex)  Hepatitis C Antibody   CBC   Lipid panel   CMP14+EGFR   Hemoglobin A1c   TSH   Ambulatory referral to Gastroenterology     Requested Prescriptions   Signed Prescriptions Disp Refills   SUMAtriptan  (IMITREX ) 50 MG tablet 90 tablet 1    Sig: Take 25 mg (1 tablet total) by mouth at the start of the headache. May repeat in 2 hours x 1 if headache persists. Max of 2 tablets/24 hours.   lisinopril  (ZESTRIL ) 40 MG tablet 90 tablet 0    Sig: Take 1 tablet (40  mg total) by mouth daily.    Return in about 1 year (around 09/15/2025) for Physical per patient preference and 4 weeks or next available chronic conditions.  Greig JINNY Chute, NP      [1]  Current Outpatient Medications on File Prior to Visit  Medication Sig Dispense Refill   acyclovir  (ZOVIRAX ) 200 MG capsule 2 capsules by mouth twice daily 120 capsule 11   acyclovir  (ZOVIRAX ) 400 MG tablet Take 1 tablet (400 mg total) by mouth 2 (two) times daily. 180 tablet 0   atenolol  (TENORMIN ) 100 MG tablet Take 1 tablet (100 mg total) by mouth daily. 90 tablet 0   dicyclomine  (BENTYL ) 20 MG tablet Take 1 tablet (20 mg total) by mouth 4 (four) times daily as needed (intestinal cramps). 40 tablet 0   erythromycin  ophthalmic ointment Place 1 Application into the left eye at bedtime. 7 g 1   escitalopram  (LEXAPRO ) 5 MG tablet Take 1 tablet (5 mg total) by mouth daily. 90 tablet 0   hydrOXYzine  (ATARAX ) 50 MG tablet Take 1 tablet (50 mg total) by mouth every 8 (eight) hours as needed. 90 tablet 1   pantoprazole  (PROTONIX ) 20 MG tablet Take 1 tablet (20 mg total) by mouth 2 (two) times daily. 180 tablet 0   triamcinolone  (KENALOG ) 0.025 % cream Apply 1 Application topically 2 (two) times daily. 60 g 2   amLODipine  (NORVASC ) 10 MG tablet Take 1 tablet (10 mg total) by mouth every evening. 90 tablet 0   Cyanocobalamin (VITAMIN B-12 PO) Take 1 capsule by mouth daily.     [DISCONTINUED] DEPO-TESTOSTERONE  200 MG/ML injection INJECT 1 ML (200 MG TOTAL) INTO THE MUSCLE EVERY 28 DAYS (Patient not taking: Reported on 06/18/2018) 10 mL 0   [DISCONTINUED] gemfibrozil  (LOPID ) 600 MG tablet TAKE ONE TABLET BY MOUTH TWICE DAILY WITH MEALS (Patient not taking: Reported on 06/18/2018) 60 tablet 2   [DISCONTINUED] metoprolol  tartrate (LOPRESSOR ) 25 MG tablet TAKE ONE TABLET BY MOUTH TWICE DAILY WITH MEALS (Patient not taking: Reported on 06/18/2018) 60 tablet 2   [DISCONTINUED] sertraline (ZOLOFT) 50 MG tablet TAKE ONE-HALF  TABLET BY MOUTH ONCE DAILY BEFORE MEAL(S) (Patient not taking: Reported on 06/18/2018) 15 tablet 2   No current facility-administered medications on file prior to visit.  [2]  Allergies Allergen Reactions   Doxycycline Itching   "

## 2024-09-16 DIAGNOSIS — E785 Hyperlipidemia, unspecified: Secondary | ICD-10-CM

## 2024-09-16 LAB — CBC
Hematocrit: 53.8 % — ABNORMAL HIGH (ref 37.5–51.0)
Hemoglobin: 17.8 g/dL — ABNORMAL HIGH (ref 13.0–17.7)
MCH: 29.6 pg (ref 26.6–33.0)
MCHC: 33.1 g/dL (ref 31.5–35.7)
MCV: 90 fL (ref 79–97)
Platelets: 321 x10E3/uL (ref 150–450)
RBC: 6.01 x10E6/uL — ABNORMAL HIGH (ref 4.14–5.80)
RDW: 13.6 % (ref 11.6–15.4)
WBC: 6.8 x10E3/uL (ref 3.4–10.8)

## 2024-09-16 LAB — HEPATITIS C ANTIBODY: Hep C Virus Ab: NONREACTIVE

## 2024-09-16 LAB — CMP14+EGFR
ALT: 31 IU/L (ref 0–44)
AST: 23 IU/L (ref 0–40)
Albumin: 4.1 g/dL (ref 3.9–4.9)
Alkaline Phosphatase: 69 IU/L (ref 47–123)
BUN/Creatinine Ratio: 9 — ABNORMAL LOW (ref 10–24)
BUN: 10 mg/dL (ref 8–27)
Bilirubin Total: 0.6 mg/dL (ref 0.0–1.2)
CO2: 22 mmol/L (ref 20–29)
Calcium: 9.5 mg/dL (ref 8.6–10.2)
Chloride: 101 mmol/L (ref 96–106)
Creatinine, Ser: 1.15 mg/dL (ref 0.76–1.27)
Globulin, Total: 2.7 g/dL (ref 1.5–4.5)
Glucose: 96 mg/dL (ref 70–99)
Potassium: 4.5 mmol/L (ref 3.5–5.2)
Sodium: 140 mmol/L (ref 134–144)
Total Protein: 6.8 g/dL (ref 6.0–8.5)
eGFR: 71 mL/min/1.73

## 2024-09-16 LAB — TSH: TSH: 1.22 u[IU]/mL (ref 0.450–4.500)

## 2024-09-16 LAB — HIV ANTIBODY (ROUTINE TESTING W REFLEX): HIV Screen 4th Generation wRfx: NONREACTIVE

## 2024-09-16 LAB — LIPID PANEL
Chol/HDL Ratio: 6.1 ratio — ABNORMAL HIGH (ref 0.0–5.0)
Cholesterol, Total: 189 mg/dL (ref 100–199)
HDL: 31 mg/dL — ABNORMAL LOW
LDL Chol Calc (NIH): 103 mg/dL — ABNORMAL HIGH (ref 0–99)
Triglycerides: 325 mg/dL — ABNORMAL HIGH (ref 0–149)
VLDL Cholesterol Cal: 55 mg/dL — ABNORMAL HIGH (ref 5–40)

## 2024-09-16 LAB — HEMOGLOBIN A1C
Est. average glucose Bld gHb Est-mCnc: 123 mg/dL
Hgb A1c MFr Bld: 5.9 % — ABNORMAL HIGH (ref 4.8–5.6)

## 2024-09-16 MED ORDER — ATORVASTATIN CALCIUM 20 MG PO TABS: 20.0000 mg | ORAL_TABLET | Freq: Every day | ORAL | 0 refills | Status: AC

## 2024-10-01 ENCOUNTER — Encounter: Payer: Self-pay | Admitting: Family

## 2024-10-01 NOTE — Progress Notes (Signed)
 Erroneous encounter-disregard

## 2024-10-13 ENCOUNTER — Ambulatory Visit: Payer: Self-pay | Admitting: Family

## 2024-10-16 ENCOUNTER — Encounter: Payer: Self-pay | Admitting: Urology

## 2024-10-16 ENCOUNTER — Other Ambulatory Visit: Payer: Self-pay | Admitting: Urology

## 2024-10-16 ENCOUNTER — Ambulatory Visit: Payer: Self-pay | Admitting: Urology

## 2024-10-16 VITALS — BP 142/86 | HR 91 | Ht 66.0 in | Wt 208.0 lb

## 2024-10-16 DIAGNOSIS — E291 Testicular hypofunction: Secondary | ICD-10-CM

## 2024-10-16 NOTE — Progress Notes (Signed)
 "  Assessment: 1. Hypogonadism in male     Plan: I personally reviewed the patient's chart including provider notes, lab results.  Options for management of hypogonadism discussed. Labs today: LH, prolactin, estradiol I discussed alternative methods for testosterone  replacement therapy.  I do not recommend that he continue with IM injections given his erythrocytosis.  I advised him that this method of testosterone  replacement has the highest risk of this.  I recommended he consider topical therapy for management of his hypogonadism. Information on testosterone  replacement therapy provided Rx for topical testosterone  20% gel sent to [facility].   Chief Complaint:  Chief Complaint  Patient presents with   Hypogonadism    History of Present Illness:  Nathan Cherry is a 64 y.o. male who is seen in consultation from Jaycee Greig PARAS, NP for evaluation of low testosterone . He has symptoms of lack of energy, fatigue, erectile dysfunction, slight loss of muscle mass, and emotional lability.  No decrease in his libido. Testosterone  level from 08/26/2024: 179 He has prior testosterone  levels from 2015 and 2016 which were in the 170-230 range. He has been using intramuscular testosterone  injections 200 mg every 28 days for the past year.  He last had an injection 2 weeks ago.  He reports some improvement in his symptoms with the injections. PSA from 8/25: 0.3 H&H 1/26: 17.8/53.8  He is not having any significant lower urinary tract symptoms at the present time.  An interpreter was used during the visit today.  Past Medical History:  Past Medical History:  Diagnosis Date   Allergy    Anxiety    History of anger issues related to this.     BPH without obstruction/lower urinary tract symptoms    Has had in past--but has been asymptomatic in 2016   External hemorrhoids    Genital herpes    Herpes    Hyperlipidemia    Hypertension    IBS (irritable bowel syndrome)     Associated with anxiety   Low testosterone     PUD (peptic ulcer disease) 2014    Past Surgical History:  No past surgical history on file.  Allergies:  Allergies[1]  Family History:  No family history on file.  Social History:  Social History[2]  Review of symptoms:  Constitutional:  Negative for unexplained weight loss, night sweats, fever, chills ENT:  Negative for nose bleeds, sinus pain, painful swallowing CV:  Negative for chest pain, shortness of breath, exercise intolerance, palpitations, loss of consciousness Resp:  Negative for cough, wheezing, shortness of breath GI:  Negative for nausea, vomiting, diarrhea, bloody stools GU:  Positives noted in HPI; otherwise negative for gross hematuria, dysuria, urinary incontinence Neuro:  Negative for seizures, poor balance, limb weakness, slurred speech Psych:  Negative for lack of energy, depression, anxiety Endocrine:  Negative for polydipsia, polyuria, symptoms of hypoglycemia (dizziness, hunger, sweating) Hematologic:  Negative for anemia, purpura, petechia, prolonged or excessive bleeding, use of anticoagulants  Allergic:  Negative for difficulty breathing or choking as a result of exposure to anything; no shellfish allergy; no allergic response (rash/itch) to materials, foods  Physical exam: BP (!) 142/86   Pulse 91   Ht 5' 6 (1.676 m)   Wt 208 lb (94.3 kg)   BMI 33.57 kg/m  GENERAL APPEARANCE:  Well appearing, well developed, well nourished, NAD HEENT: Atraumatic, Normocephalic, oropharynx clear. NECK: Supple without lymphadenopathy or thyromegaly. LUNGS: Clear to auscultation bilaterally. HEART: Regular Rate and Rhythm without murmurs, gallops, or rubs. ABDOMEN: Soft,  non-tender, No Masses. EXTREMITIES: Moves all extremities well.  Without clubbing, cyanosis, or edema. NEUROLOGIC:  Alert and oriented x 3, normal gait, CN II-XII grossly intact.  MENTAL STATUS:  Appropriate. BACK:  Non-tender to palpation.  No  CVAT SKIN:  Warm, dry and intact.   GU: Penis:  uncircumcised Meatus: Normal Scrotum: normal, no masses Testis: normal without masses bilateral Prostate: 30 gm, NT, no nodules Rectum: Normal tone,  no masses or tenderness   Results: None     [1]  Allergies Allergen Reactions   Doxycycline Itching  [2]  Social History Tobacco Use   Smoking status: Never   Smokeless tobacco: Never  Vaping Use   Vaping status: Never Used  Substance Use Topics   Alcohol use: No    Alcohol/week: 0.0 standard drinks of alcohol    Comment: No alcohol since 2005.  History of drinking too much.   Drug use: No   "

## 2024-10-17 LAB — ESTRADIOL: Estradiol: <5 pg/mL — ABNORMAL LOW (ref 7.6–42.6)

## 2024-10-17 LAB — PROLACTIN: Prolactin: 11.6 ng/mL (ref 3.6–25.2)

## 2024-10-17 LAB — LUTEINIZING HORMONE: LH: 0.3 m[IU]/mL — ABNORMAL LOW (ref 1.7–8.6)

## 2024-11-13 ENCOUNTER — Other Ambulatory Visit: Payer: Self-pay

## 2024-11-21 ENCOUNTER — Ambulatory Visit: Payer: Self-pay | Admitting: Family

## 2025-09-15 ENCOUNTER — Encounter: Payer: Self-pay | Admitting: Family
